# Patient Record
Sex: Female | Born: 1965 | Race: White | Hispanic: No | Marital: Married | Smoking: Current every day smoker
Health system: Southern US, Community
[De-identification: ages and names within clinical notes are randomized; demographics above are authoritative.]

## PROBLEM LIST (undated history)

## (undated) DIAGNOSIS — R51 Headache: Secondary | ICD-10-CM

## (undated) DIAGNOSIS — K219 Gastro-esophageal reflux disease without esophagitis: Secondary | ICD-10-CM

## (undated) DIAGNOSIS — F419 Anxiety disorder, unspecified: Secondary | ICD-10-CM

## (undated) DIAGNOSIS — F32A Depression, unspecified: Secondary | ICD-10-CM

## (undated) DIAGNOSIS — F17200 Nicotine dependence, unspecified, uncomplicated: Secondary | ICD-10-CM

## (undated) DIAGNOSIS — K7010 Alcoholic hepatitis without ascites: Secondary | ICD-10-CM

## (undated) DIAGNOSIS — R772 Abnormality of alphafetoprotein: Secondary | ICD-10-CM

## (undated) DIAGNOSIS — K449 Diaphragmatic hernia without obstruction or gangrene: Secondary | ICD-10-CM

## (undated) DIAGNOSIS — K769 Liver disease, unspecified: Secondary | ICD-10-CM

## (undated) DIAGNOSIS — K8020 Calculus of gallbladder without cholecystitis without obstruction: Secondary | ICD-10-CM

## (undated) DIAGNOSIS — F329 Major depressive disorder, single episode, unspecified: Secondary | ICD-10-CM

## (undated) DIAGNOSIS — K7460 Unspecified cirrhosis of liver: Secondary | ICD-10-CM

## (undated) DIAGNOSIS — D649 Anemia, unspecified: Secondary | ICD-10-CM

## (undated) HISTORY — DX: Liver disease, unspecified: K76.9

## (undated) HISTORY — DX: Diaphragmatic hernia without obstruction or gangrene: K44.9

## (undated) HISTORY — DX: Abnormality of alphafetoprotein: R77.2

## (undated) HISTORY — DX: Anemia, unspecified: D64.9

## (undated) HISTORY — DX: Alcoholic hepatitis without ascites: K70.10

## (undated) HISTORY — DX: Calculus of gallbladder without cholecystitis without obstruction: K80.20

---

## 2001-03-29 ENCOUNTER — Ambulatory Visit (HOSPITAL_COMMUNITY): Admission: RE | Admit: 2001-03-29 | Discharge: 2001-03-29 | Payer: Self-pay | Admitting: General Surgery

## 2001-09-02 ENCOUNTER — Other Ambulatory Visit: Admission: RE | Admit: 2001-09-02 | Discharge: 2001-09-02 | Payer: Self-pay | Admitting: Family Medicine

## 2003-12-28 ENCOUNTER — Ambulatory Visit (HOSPITAL_COMMUNITY): Admission: RE | Admit: 2003-12-28 | Discharge: 2003-12-28 | Payer: Self-pay | Admitting: Family Medicine

## 2006-03-05 ENCOUNTER — Ambulatory Visit (HOSPITAL_COMMUNITY): Admission: RE | Admit: 2006-03-05 | Discharge: 2006-03-05 | Payer: Self-pay | Admitting: Family Medicine

## 2006-12-19 ENCOUNTER — Ambulatory Visit (HOSPITAL_COMMUNITY): Admission: RE | Admit: 2006-12-19 | Discharge: 2006-12-19 | Payer: Self-pay | Admitting: Family Medicine

## 2007-09-10 ENCOUNTER — Other Ambulatory Visit: Admission: RE | Admit: 2007-09-10 | Discharge: 2007-09-10 | Payer: Self-pay | Admitting: Obstetrics and Gynecology

## 2007-09-19 ENCOUNTER — Ambulatory Visit (HOSPITAL_COMMUNITY): Admission: RE | Admit: 2007-09-19 | Discharge: 2007-09-19 | Payer: Self-pay | Admitting: Family Medicine

## 2007-10-10 ENCOUNTER — Ambulatory Visit (HOSPITAL_COMMUNITY): Admission: RE | Admit: 2007-10-10 | Discharge: 2007-10-10 | Payer: Self-pay | Admitting: Family Medicine

## 2007-12-24 ENCOUNTER — Emergency Department (HOSPITAL_COMMUNITY): Admission: EM | Admit: 2007-12-24 | Discharge: 2007-12-24 | Payer: Self-pay | Admitting: Emergency Medicine

## 2007-12-24 ENCOUNTER — Ambulatory Visit: Payer: Self-pay | Admitting: Psychiatry

## 2007-12-24 ENCOUNTER — Inpatient Hospital Stay (HOSPITAL_COMMUNITY): Admission: RE | Admit: 2007-12-24 | Discharge: 2007-12-28 | Payer: Self-pay | Admitting: Psychiatry

## 2008-01-01 ENCOUNTER — Ambulatory Visit (HOSPITAL_COMMUNITY): Payer: Self-pay | Admitting: Psychiatry

## 2008-01-06 ENCOUNTER — Ambulatory Visit (HOSPITAL_COMMUNITY): Payer: Self-pay | Admitting: Psychiatry

## 2008-01-13 ENCOUNTER — Ambulatory Visit (HOSPITAL_COMMUNITY): Payer: Self-pay | Admitting: Psychiatry

## 2009-12-21 ENCOUNTER — Ambulatory Visit (HOSPITAL_COMMUNITY): Admission: RE | Admit: 2009-12-21 | Discharge: 2009-12-21 | Payer: Self-pay | Admitting: Family Medicine

## 2010-01-27 ENCOUNTER — Ambulatory Visit (HOSPITAL_COMMUNITY): Admission: RE | Admit: 2010-01-27 | Discharge: 2010-01-27 | Payer: Self-pay | Admitting: Family Medicine

## 2010-05-27 ENCOUNTER — Ambulatory Visit (HOSPITAL_COMMUNITY): Admission: RE | Admit: 2010-05-27 | Discharge: 2010-05-27 | Payer: Self-pay | Admitting: Family Medicine

## 2010-06-16 ENCOUNTER — Ambulatory Visit: Payer: Self-pay | Admitting: Internal Medicine

## 2010-06-16 ENCOUNTER — Ambulatory Visit (HOSPITAL_COMMUNITY): Admission: RE | Admit: 2010-06-16 | Discharge: 2010-06-16 | Payer: Self-pay | Admitting: Internal Medicine

## 2010-06-16 ENCOUNTER — Encounter: Payer: Self-pay | Admitting: Gastroenterology

## 2010-06-16 DIAGNOSIS — R740 Nonspecific elevation of levels of transaminase and lactic acid dehydrogenase [LDH]: Secondary | ICD-10-CM

## 2010-06-16 DIAGNOSIS — F1020 Alcohol dependence, uncomplicated: Secondary | ICD-10-CM

## 2010-06-16 DIAGNOSIS — R188 Other ascites: Secondary | ICD-10-CM

## 2010-06-16 DIAGNOSIS — K219 Gastro-esophageal reflux disease without esophagitis: Secondary | ICD-10-CM

## 2010-06-16 DIAGNOSIS — D72829 Elevated white blood cell count, unspecified: Secondary | ICD-10-CM | POA: Insufficient documentation

## 2010-06-16 DIAGNOSIS — K7010 Alcoholic hepatitis without ascites: Secondary | ICD-10-CM

## 2010-06-16 DIAGNOSIS — D539 Nutritional anemia, unspecified: Secondary | ICD-10-CM | POA: Insufficient documentation

## 2010-06-16 DIAGNOSIS — R634 Abnormal weight loss: Secondary | ICD-10-CM

## 2010-06-16 DIAGNOSIS — R197 Diarrhea, unspecified: Secondary | ICD-10-CM

## 2010-06-16 DIAGNOSIS — K921 Melena: Secondary | ICD-10-CM

## 2010-06-16 DIAGNOSIS — K5289 Other specified noninfective gastroenteritis and colitis: Secondary | ICD-10-CM

## 2010-06-16 DIAGNOSIS — R1310 Dysphagia, unspecified: Secondary | ICD-10-CM | POA: Insufficient documentation

## 2010-06-16 DIAGNOSIS — R05 Cough: Secondary | ICD-10-CM

## 2010-06-17 ENCOUNTER — Encounter: Payer: Self-pay | Admitting: Gastroenterology

## 2010-06-17 DIAGNOSIS — R799 Abnormal finding of blood chemistry, unspecified: Secondary | ICD-10-CM

## 2010-06-21 ENCOUNTER — Encounter: Payer: Self-pay | Admitting: Gastroenterology

## 2010-06-21 ENCOUNTER — Encounter: Payer: Self-pay | Admitting: Internal Medicine

## 2010-06-21 LAB — CONVERTED CEMR LAB: Hep A IgM: NEGATIVE

## 2010-06-22 ENCOUNTER — Encounter (INDEPENDENT_AMBULATORY_CARE_PROVIDER_SITE_OTHER): Payer: Self-pay | Admitting: *Deleted

## 2010-06-22 LAB — CONVERTED CEMR LAB
A-1 Antitrypsin, Ser: 303 mg/dL — ABNORMAL HIGH (ref 83–200)
ALT: 28 units/L (ref 0–35)
AST: 155 units/L — ABNORMAL HIGH (ref 0–37)
CO2: 21 meq/L (ref 19–32)
Calcium: 7.4 mg/dL — ABNORMAL LOW (ref 8.4–10.5)
Ceruloplasmin: 39 mg/dL (ref 21–63)
Chloride: 98 meq/L (ref 96–112)
Creatinine, Ser: 0.42 mg/dL (ref 0.40–1.20)
Folate: 4.8 ng/mL
HCV Ab: NEGATIVE
INR: 1.45 (ref <1.50)
IgM, Serum: 367 mg/dL — ABNORMAL HIGH (ref 60–263)
Lymphocytes Relative: 9 % — ABNORMAL LOW (ref 12–46)
Lymphs Abs: 1.6 10*3/uL (ref 0.7–4.0)
MCV: 101.7 fL — ABNORMAL HIGH (ref 78.0–100.0)
Monocytes Relative: 9 % (ref 3–12)
Neutro Abs: 14.5 10*3/uL — ABNORMAL HIGH (ref 1.7–7.7)
Neutrophils Relative %: 82 % — ABNORMAL HIGH (ref 43–77)
Platelets: 194 10*3/uL (ref 150–400)
Potassium: 3.1 meq/L — ABNORMAL LOW (ref 3.5–5.3)
Prothrombin Time: 17.5 s — ABNORMAL HIGH (ref 11.6–15.2)
RBC: 3.56 M/uL — ABNORMAL LOW (ref 3.87–5.11)
Sodium: 135 meq/L (ref 135–145)
Total Protein: 8.1 g/dL (ref 6.0–8.3)
WBC: 17.8 10*3/uL — ABNORMAL HIGH (ref 4.0–10.5)

## 2010-06-23 ENCOUNTER — Encounter: Payer: Self-pay | Admitting: Gastroenterology

## 2010-06-23 ENCOUNTER — Telehealth (INDEPENDENT_AMBULATORY_CARE_PROVIDER_SITE_OTHER): Payer: Self-pay

## 2010-06-27 ENCOUNTER — Encounter: Payer: Self-pay | Admitting: Gastroenterology

## 2010-06-27 HISTORY — PX: COLONOSCOPY: SHX174

## 2010-06-27 LAB — CONVERTED CEMR LAB
ALT: 34 units/L (ref 0–35)
Albumin: 1.5 g/dL — ABNORMAL LOW (ref 3.5–5.2)
Alkaline Phosphatase: 241 units/L — ABNORMAL HIGH (ref 39–117)
Indirect Bilirubin: 2.1 mg/dL — ABNORMAL HIGH (ref 0.0–0.9)
Prothrombin Time: 18.4 s — ABNORMAL HIGH (ref 11.6–15.2)
Total Protein: 8.3 g/dL (ref 6.0–8.3)

## 2010-06-28 ENCOUNTER — Encounter: Payer: Self-pay | Admitting: Internal Medicine

## 2010-06-29 ENCOUNTER — Ambulatory Visit: Payer: Self-pay | Admitting: Internal Medicine

## 2010-06-29 ENCOUNTER — Ambulatory Visit (HOSPITAL_COMMUNITY): Admission: RE | Admit: 2010-06-29 | Discharge: 2010-06-29 | Payer: Self-pay | Admitting: Internal Medicine

## 2010-07-01 ENCOUNTER — Ambulatory Visit (HOSPITAL_COMMUNITY): Admission: RE | Admit: 2010-07-01 | Discharge: 2010-07-01 | Payer: Self-pay | Admitting: Internal Medicine

## 2010-07-04 ENCOUNTER — Encounter (INDEPENDENT_AMBULATORY_CARE_PROVIDER_SITE_OTHER): Payer: Self-pay

## 2010-07-05 ENCOUNTER — Encounter (INDEPENDENT_AMBULATORY_CARE_PROVIDER_SITE_OTHER): Payer: Self-pay

## 2010-07-06 ENCOUNTER — Encounter: Payer: Self-pay | Admitting: Gastroenterology

## 2010-07-08 ENCOUNTER — Encounter: Payer: Self-pay | Admitting: Gastroenterology

## 2010-07-12 ENCOUNTER — Encounter (INDEPENDENT_AMBULATORY_CARE_PROVIDER_SITE_OTHER): Payer: Self-pay

## 2010-07-12 ENCOUNTER — Telehealth (INDEPENDENT_AMBULATORY_CARE_PROVIDER_SITE_OTHER): Payer: Self-pay | Admitting: *Deleted

## 2010-07-12 ENCOUNTER — Encounter (INDEPENDENT_AMBULATORY_CARE_PROVIDER_SITE_OTHER): Payer: Self-pay | Admitting: *Deleted

## 2010-07-12 ENCOUNTER — Encounter: Payer: Self-pay | Admitting: Gastroenterology

## 2010-07-12 LAB — CONVERTED CEMR LAB
ALT: 28 units/L (ref 0–35)
AST: 56 units/L — ABNORMAL HIGH (ref 0–37)
Alkaline Phosphatase: 151 units/L — ABNORMAL HIGH (ref 39–117)
Basophils Absolute: 0 10*3/uL (ref 0.0–0.1)
Basophils Relative: 0 % (ref 0–1)
INR: 1.11 (ref <1.50)
Lymphocytes Relative: 6 % — ABNORMAL LOW (ref 12–46)
MCHC: 32.5 g/dL (ref 30.0–36.0)
Neutro Abs: 20.6 10*3/uL — ABNORMAL HIGH (ref 1.7–7.7)
Neutrophils Relative %: 91 % — ABNORMAL HIGH (ref 43–77)
RBC: 3.14 M/uL — ABNORMAL LOW (ref 3.87–5.11)
RDW: 16.9 % — ABNORMAL HIGH (ref 11.5–15.5)
Sodium: 129 meq/L — ABNORMAL LOW (ref 135–145)
Total Bilirubin: 1.8 mg/dL — ABNORMAL HIGH (ref 0.3–1.2)
Total Protein: 7 g/dL (ref 6.0–8.3)

## 2010-07-15 ENCOUNTER — Telehealth (INDEPENDENT_AMBULATORY_CARE_PROVIDER_SITE_OTHER): Payer: Self-pay

## 2010-07-18 ENCOUNTER — Ambulatory Visit (HOSPITAL_COMMUNITY): Admission: RE | Admit: 2010-07-18 | Discharge: 2010-07-18 | Payer: Self-pay | Admitting: Internal Medicine

## 2010-07-18 ENCOUNTER — Emergency Department (HOSPITAL_COMMUNITY): Admission: EM | Admit: 2010-07-18 | Discharge: 2010-07-18 | Payer: Self-pay | Admitting: Emergency Medicine

## 2010-07-19 ENCOUNTER — Inpatient Hospital Stay (HOSPITAL_COMMUNITY): Admission: RE | Admit: 2010-07-19 | Discharge: 2010-07-22 | Payer: Self-pay | Admitting: Family Medicine

## 2010-07-20 ENCOUNTER — Encounter (INDEPENDENT_AMBULATORY_CARE_PROVIDER_SITE_OTHER): Payer: Self-pay

## 2010-07-20 ENCOUNTER — Ambulatory Visit: Payer: Self-pay | Admitting: Oncology

## 2010-07-20 ENCOUNTER — Telehealth (INDEPENDENT_AMBULATORY_CARE_PROVIDER_SITE_OTHER): Payer: Self-pay

## 2010-07-20 ENCOUNTER — Ambulatory Visit: Payer: Self-pay | Admitting: Internal Medicine

## 2010-07-22 ENCOUNTER — Encounter: Payer: Self-pay | Admitting: Internal Medicine

## 2010-07-25 ENCOUNTER — Inpatient Hospital Stay (HOSPITAL_COMMUNITY): Admission: AD | Admit: 2010-07-25 | Discharge: 2010-07-31 | Payer: Self-pay | Admitting: Family Medicine

## 2010-07-26 ENCOUNTER — Encounter (INDEPENDENT_AMBULATORY_CARE_PROVIDER_SITE_OTHER): Payer: Self-pay | Admitting: Family Medicine

## 2010-07-26 ENCOUNTER — Encounter: Payer: Self-pay | Admitting: Internal Medicine

## 2010-07-26 ENCOUNTER — Ambulatory Visit: Payer: Self-pay | Admitting: Gastroenterology

## 2010-07-28 ENCOUNTER — Ambulatory Visit: Payer: Self-pay | Admitting: Internal Medicine

## 2010-07-29 ENCOUNTER — Ambulatory Visit: Payer: Self-pay | Admitting: Internal Medicine

## 2010-08-02 ENCOUNTER — Encounter: Payer: Self-pay | Admitting: Internal Medicine

## 2010-08-04 ENCOUNTER — Ambulatory Visit (HOSPITAL_COMMUNITY): Admission: RE | Admit: 2010-08-04 | Discharge: 2010-08-04 | Payer: Self-pay | Admitting: Family Medicine

## 2010-08-12 ENCOUNTER — Encounter: Payer: Self-pay | Admitting: Gastroenterology

## 2010-09-14 IMAGING — US US PARACENTESIS
1 series · 4 of 4 positions shown · non-contrast
Comparison: CT dated 05/27/2010.

CLINICAL DATA: Ascites.

ULTRASOUND GUIDED PARACENTESIS

[Series 1: us paracentesis · 0.30mm/px · 4 of 4 slices shown]
[im 1/4]
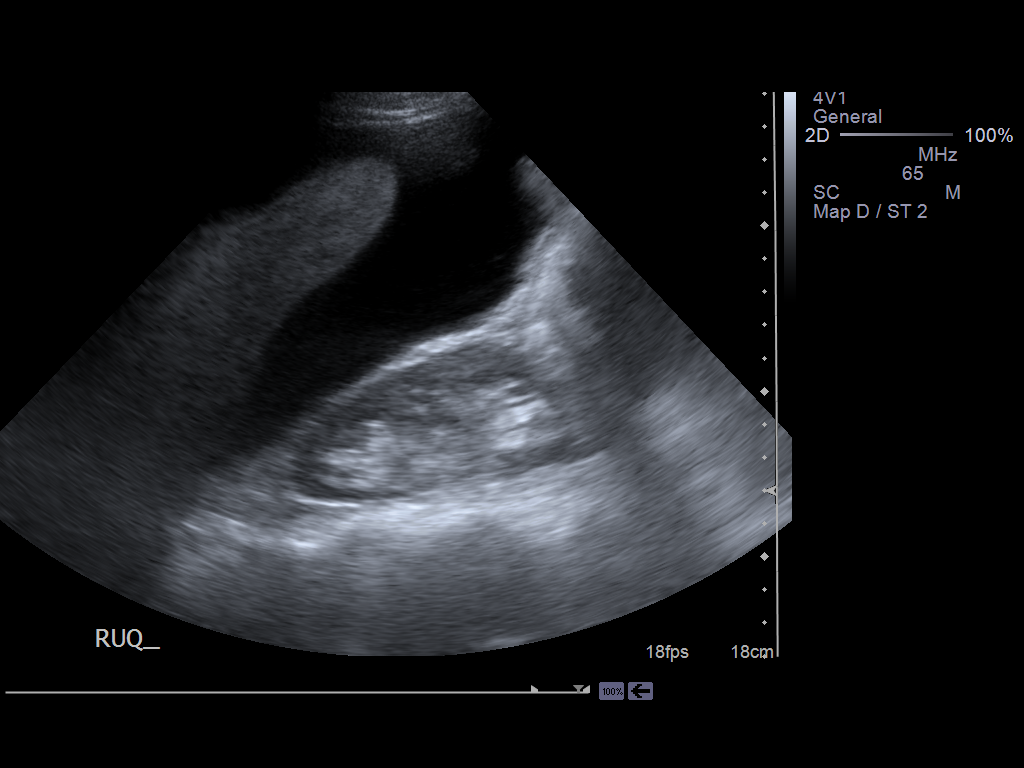
[im 2/4]
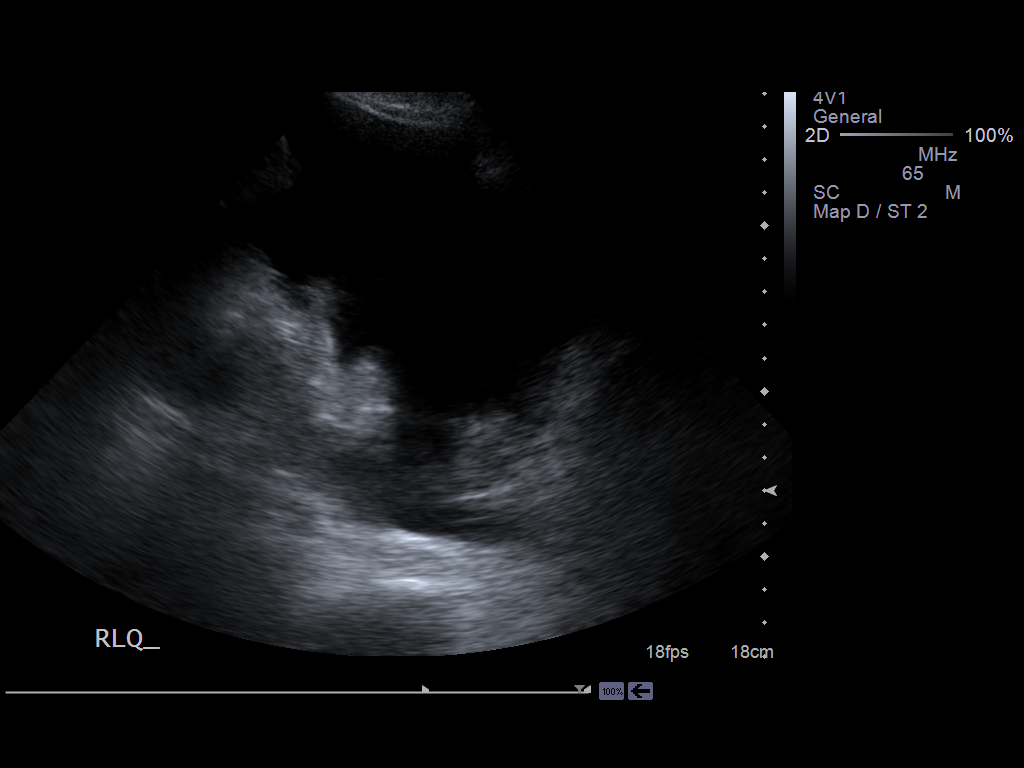
[im 3/4]
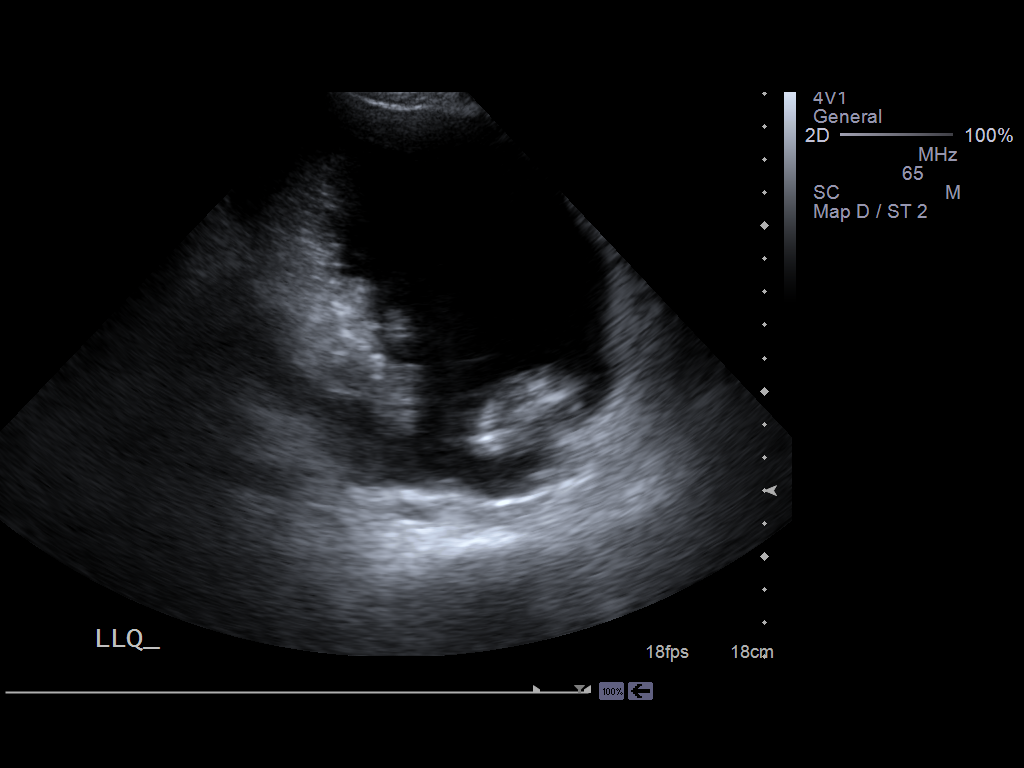
[im 4/4]
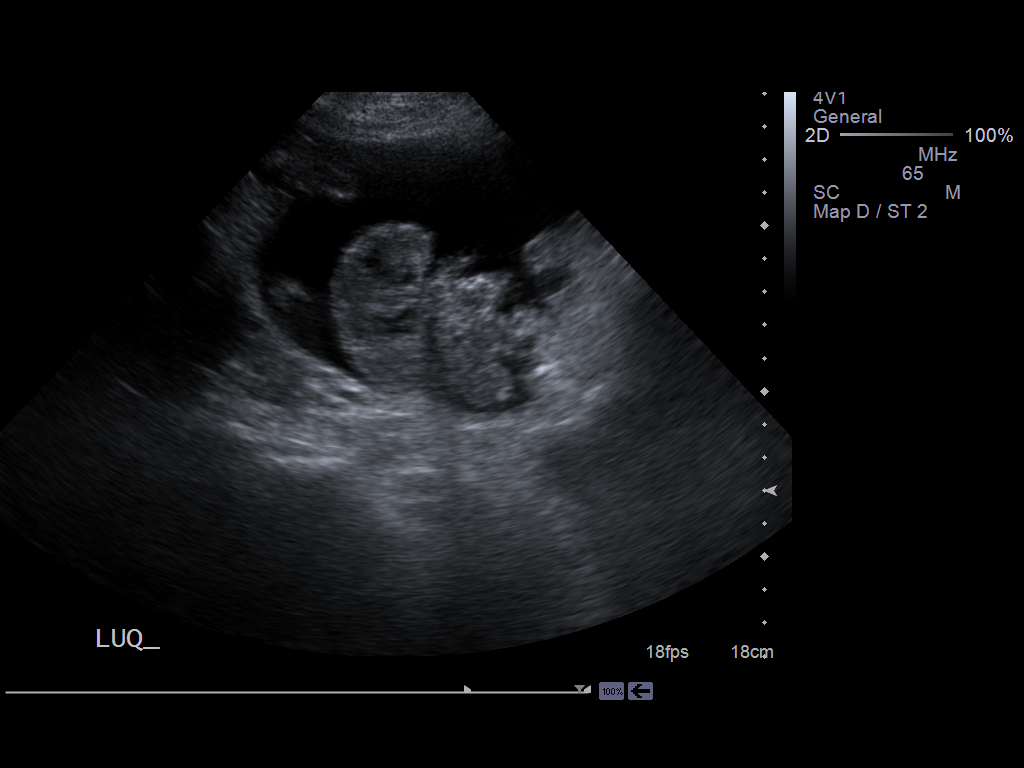

[4 of 4 positions shown; findings below may reference images not displayed]

Description: The procedure and risks were discussed with the
patient, including the risks of bleeding and infection.  Informed
written consent was obtained.

Preliminary sonographic evaluation of the abdomen demonstrated a
moderate amount of ascites.  A suitable site for paracentesis was
chosen in the right lower quadrant of the abdomen.  Using sterile
technique and local anesthesia, a 19 gauge Mewangi needle was inserted
into the peritoneal cavity.  2.95 liters of clear, straw-colored
fluid was withdrawn.  180 ml was sent to the laboratory for
testing.  The patient tolerated the procedure well with no
immediate complications.
IMPRESSION: Successful ultrasound-guided paracentesis yielding 2.95 liters of
straw-colored fluid.

## 2010-10-09 IMAGING — US US PARACENTESIS
1 series · 5 of 5 positions shown · non-contrast
Comparison: none

CLINICAL DATA: Ascites, cirrhosis

ULTRASOUND-GUIDED PARACENTESIS
TECHNIQUE: An ultrasound-guided paracentesis was thoroughly
discussed with the patient and questions answered.  The benefits,
risks, alternatives and complications were also discussed.  The
patient understands and wishes to proceed with the procedure.  A
verbal as well as written consent was obtained. Ultrasound was
performed to localize and mark an adequate pocket of fluid in the
right lower quadrant of the abdomen.  The area was then prepped and
draped in the normal sterile fashion.  1% Lidocaine was used for
local anesthesia.  Under ultrasound guidance a 19-gauge Yueh
catheter was introduced yielding approximately 3.6 liters of straw-
colored fluid.  The patient tolerated the procedure well and there
were no immediate complications.
Fluid was sent to cytology, as requested.

[Series 1: us paracentesis · 0.39mm/px · 5 of 5 slices shown]
[im 1/5]
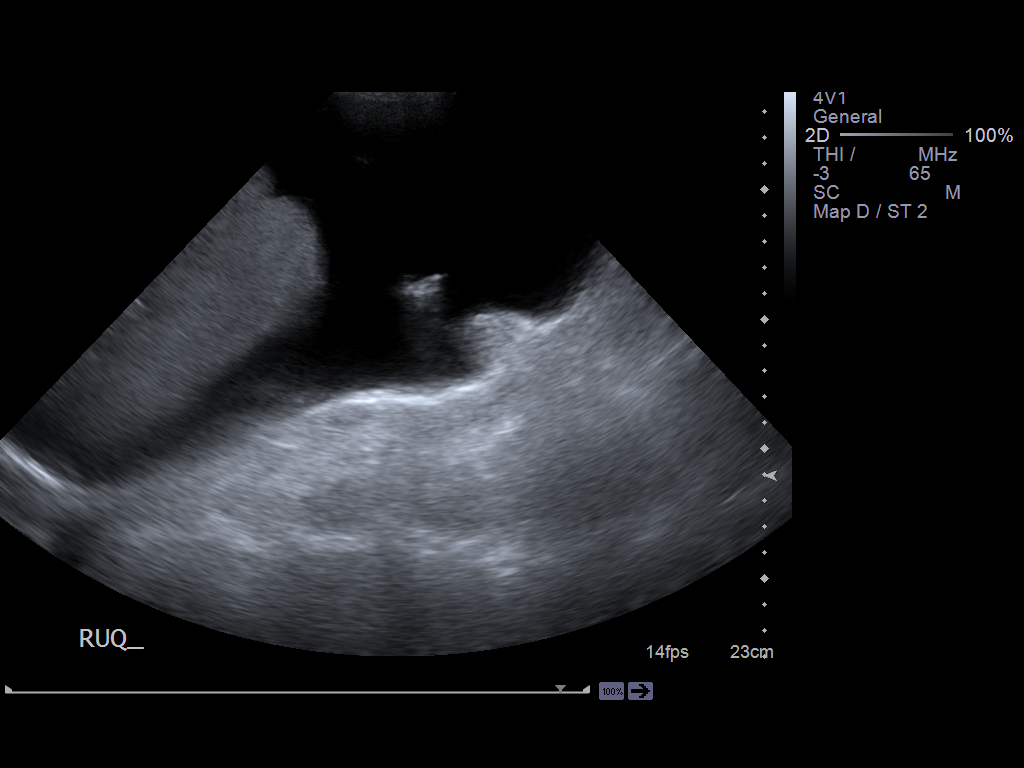
[im 2/5]
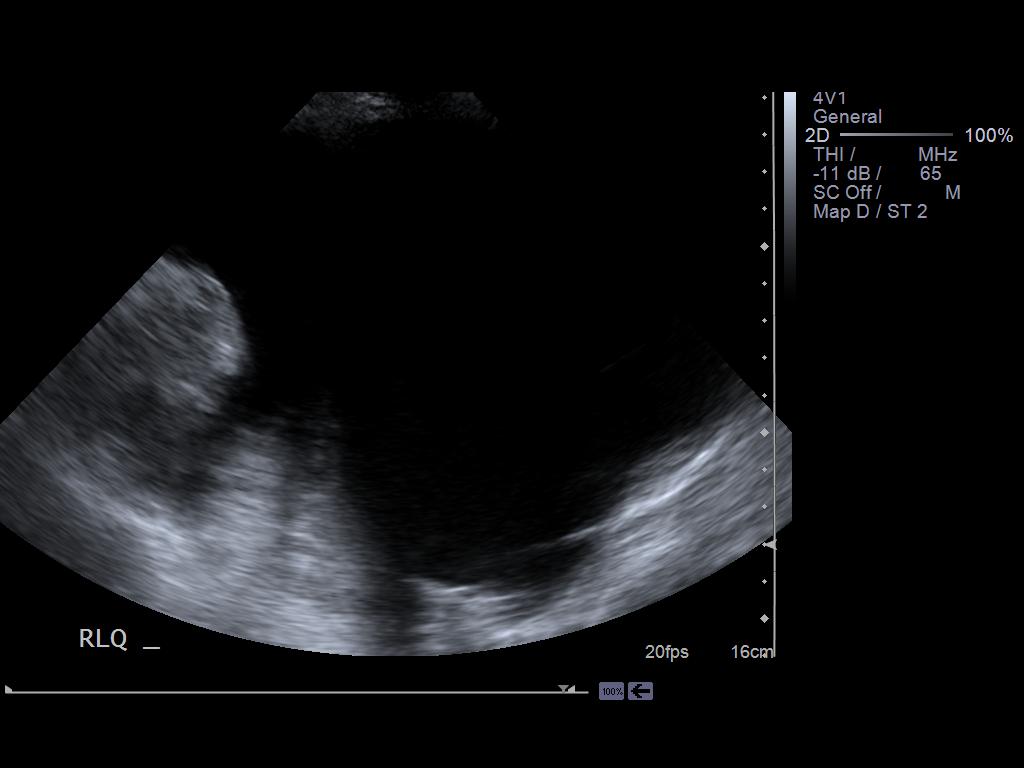
[im 3/5]
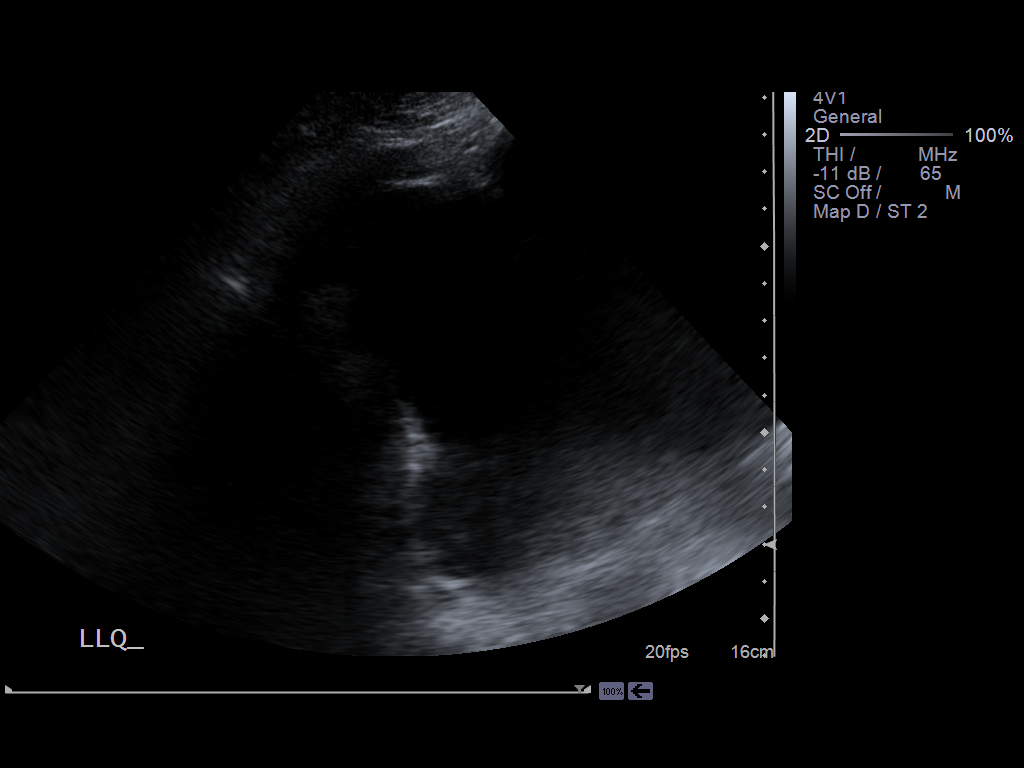
[im 4/5]
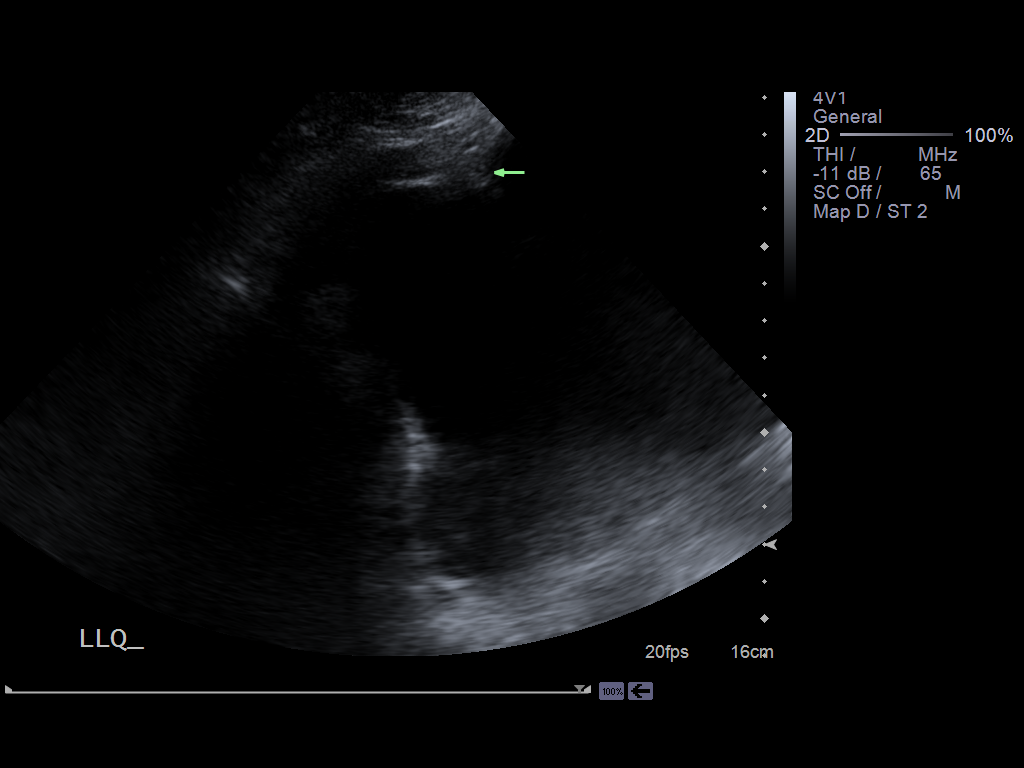
[im 5/5]
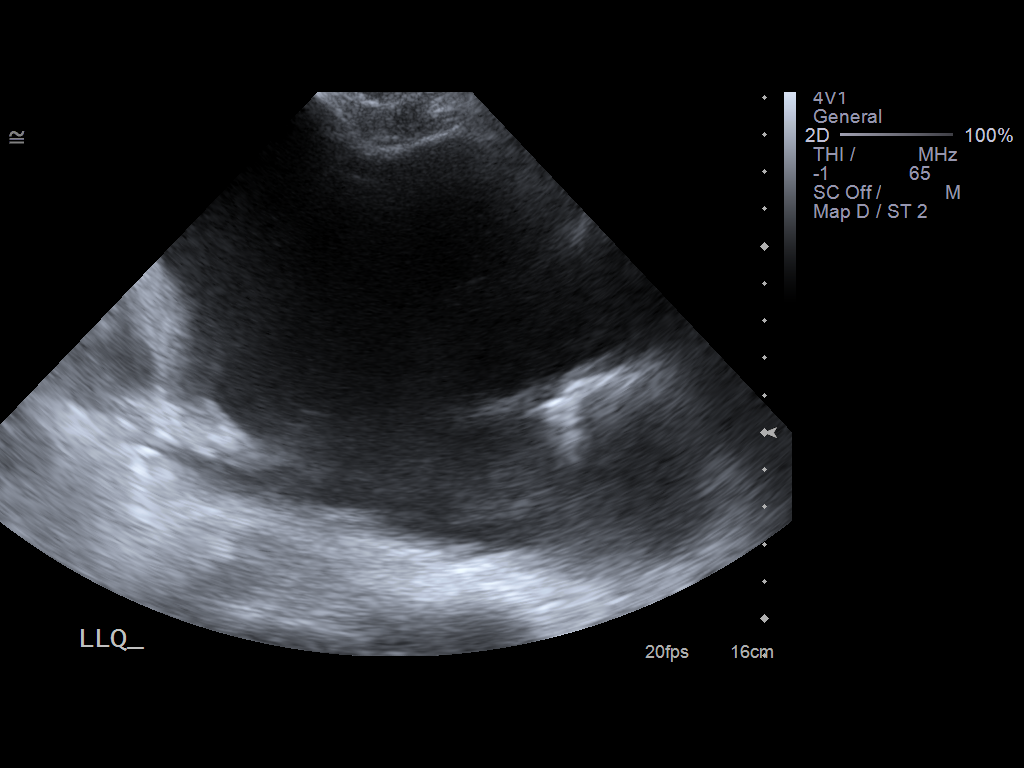

[5 of 5 positions shown; findings below may reference images not displayed]

IMPRESSION: Successful ultrasound-guided paracentesis yielding 3.6 liters of
straw-colored fluid.

## 2010-12-27 NOTE — Letter (Signed)
Summary: LABS  LABS   Imported By: Rexene Alberts 06/28/2010 16:14:52  _____________________________________________________________________  External Attachment:    Type:   Image     Comment:   External Document

## 2010-12-27 NOTE — Letter (Signed)
Summary: EGD/TCS OR order  EGD/TCS OR order   Imported By: Minna Merritts 06/16/2010 13:47:49  _____________________________________________________________________  External Attachment:    Type:   Image     Comment:   External Document  Appended Document: EGD/TCS OR order At scheduling forgot to give pt. pre-op visit info.  called and spoke with pt today.  She is aware to go for pre-op on 06/22/10 @ 10am

## 2010-12-27 NOTE — Miscellaneous (Signed)
Summary: Orders Update  Clinical Lists Changes  Orders: Added new Test order of T-Hepatic Function ([id number]) - Signed 

## 2010-12-27 NOTE — Miscellaneous (Signed)
Summary: Orders Update  Clinical Lists Changes  Orders: Added new Test order of T-PT (Prothrombin Time) (16109) - Signed Added new Test order of T-PTT ([id number]) - Signed

## 2010-12-27 NOTE — Miscellaneous (Signed)
Summary: Orders Update  Clinical Lists Changes  Problems: Added new problem of BLOOD CHEMISTRY, ABNORMAL (ICD-790.99) Orders: Added new Test order of T-Comprehensive Metabolic Panel [phone]) - Signed Added new Test order of T-PT (Prothrombin Time) (08657) - Signed

## 2010-12-27 NOTE — Letter (Signed)
Summary: ?? RE: LFT bloodwork  ---- Converted from flag ---- ---- 06/22/2010 5:44 PM, Minna Merritts wrote: [facility], Per our discussion: We can take a look at it, but I didn't check for LFTs b/c it was not on the encounter form which is what I work from to schedule pts in OR and do paperwork (see scanned copy of OV encounter in 1st EGD/TCS order).  I see orders to recheck labs was added by JL and it appears they were sent on 7/22 for the CMP & PT and entered by LL in the EMR system, but didn't see any other order for LFTs until the append from LL on 7/27 @ 4:59p ...we ALWAYS just check per [facility] orders b/c we don't know what all they want unless something else is indicated; and the only reason I refaxed an order was due to the scheduling error that I made mixing up 8/2 & 8/3 and on the second one added the HCG that was ordered in append, but still only left per A orders as protocol.  ---- 06/22/2010 4:57 PM, Leanna Battles. Dixon Boos wrote: Angelica Chessman only faxed order for HCG. She apparently did not know the CMET and PT/INR were not on the original preop order. Pt has to now go tomorrow to have labs done. Please arrange. Need done first thing in AM.  ---- 06/22/2010 4:33 PM, Cloria Spring LPN wrote: Was done and faxed by [facility] on 06/21/2010.  ---- 06/21/2010 2:24 PM, Leanna Battles. Dixon Boos wrote: pt has pre-op tomorrow. also due for labs tomorrow. please have preop draw them. send them order. note I added hcg as well as planned labs. ------------------------------

## 2010-12-27 NOTE — Miscellaneous (Signed)
Summary: fluid results from [facility]  Clinical Lists Changes Culture, Body Fluid(Bottle) - STATUS: Prelim  SEE NOTE.                                 Perform Date: 5 Aug11 10:30  Ordered By: Jena Gauss MD , Gerrit Friends           Ordered Date: 5 Aug11 13:02  Facility: [facility]                               Department: MICR  Service Report Text     SPECIMEN OBTAINED:            07/01/2010 10:30  SPECIMEN DESCRIPTION:         FLUID                                ASCITIC  SPECIAL REQUESTS:             BOTTLES DRAWN AEROBIC AND ANAEROBIC                                10CC EACH  CULTURE:                      NO GROWTH 4 DAYS  REPORT STATUS:                PRELIMINARY  Additional Information  HL7 RESULT STATUS : P  External IF Update Timestamp : 2010-07-05:11:18:00.000000     L-Cell Count and Differential, Fluid - STATUS: Final                                            Perform Date: 5 Aug11 10:30  Ordered By: Jena Gauss MD , Gerrit Friends           Ordered Date: 5 Aug11 13:04                                       Last Updated Date: 5 Aug11 18:36  Facility: [facility]                               Department: GENL  Accession #: [id number]                    USN:       [id number]  Findings  Result Name                              Result     Abnl   Normal Range     Units      Perf. Loc.  Fluid Type-FCT                           ASCITIC  Color, Fluid  YELLOW            YEL  Appearance-Fluid                           HAZY       a      CLEAR  WBC Count-FLD                            155               0-1000           cu mm  Segmented Neutrophils-Fld              46         h      0-25             %  Lymphocytes-FLUID                        2                                  %  Monocyte-Macrophage-Serous Fluid    52                50-90            %  Eosinophils-Fluid                                0                                  %  Other Cells-FLD                           SEE NOTE.                          %    OTHER CELLS IDENTIFIED AS MESOTHELIAL CELLS  Additional Information  HL7 RESULT STATUS : F  External IF Update Timestamp : 2010-07-01:18:32:00.000000     Gram Stain - STATUS: Final  SEE NOTE.                                 Perform Date: 5 Aug11 10:30  Ordered By: Jena Gauss MD , Gerrit Friends           Ordered Date: 5 Aug11 13:02  Facility: [facility]                               Department: MICR  Service Report Text     SPECIMEN OBTAINED:            07/01/2010 10:30  SPECIMEN DESCRIPTION:         ASCITIC  SPECIAL REQUESTS:             NONE  GRAM SMEAR:                   NO ORGANISMS SEEN  REPORT STATUS:  FINAL 07/01/2010  Additional Information  HL7 RESULT STATUS : F  External IF Update Timestamp : 2010-07-01:14:26:00.000000  Appended Document: Orders Update Please let pt know, her abd fluid tap showed no signs of infection.  She is now on prednisone. Should recheck CBC, CMET, PT/INR next week. Abstain from alcohol.  RMR should provide further recommendations regarding colon biopsies.   Clinical Lists Changes  Orders: Added new Test order of T-Comprehensive Metabolic Panel [phone]) - Signed Added new Test order of T-CBC w/Diff ([id number]) - Signed Added new Test order of T-PT (Prothrombin Time) (56387) - Signed      Appended Document: fluid results from [facility] Pt informed. Lab order being faxed to [facility]. Pt wants to know what about the fluid she has now, said she has a "little bit" of fluid. Please advise!  Appended Document: fluid results from [facility] Pt said she has some abdominal fluid.....she also said to let Verlon Au know that she is getting very nervous at times, and she is taking the medication for the tremors, but still getting very anxious. Said she has not had alcohol x 2 1/2 weeks.   Appended Document: fluid results from [facility] 1. Needs OV with RMR in next 2-3 weeks. 2. She should weigh herself at least  once a week and let us know if weight up by 5 pounds. She can come here if needed for weights. 3. May increase her Librium (chlordiazepoxide) to 25-50mg  by mouth every 6 hours as needed. Do not exceed 300mg  daily. May call in #30 and 0rfs if needed.   4. She can increase her aldactone to 50mg  by mouth daily. May give #30, Orf if needed.  Appended Document: fluid results from [facility] LMOM to call.  Appended Document: fluid results from [facility] Pt informed. Called Rx's to [employer].   Appended Document: fluid results from [facility] PT AWARE OF APPT 07/26/10 @415  Burnett Harry

## 2010-12-27 NOTE — Letter (Signed)
Summary: CXR  CXR   Imported By: Minna Merritts 06/16/2010 13:46:30  _____________________________________________________________________  External Attachment:    Type:   Image     Comment:   External Document

## 2010-12-27 NOTE — Letter (Signed)
Summary: CYTOPATHOLOGY REPORT  CYTOPATHOLOGY REPORT   Imported By: Rexene Alberts 08/02/2010 10:21:25  _____________________________________________________________________  External Attachment:    Type:   Image     Comment:   External Document

## 2010-12-27 NOTE — Progress Notes (Signed)
----   Converted from flag ---- ---- 07/12/2010 8:21 AM, Leanna Battles. Dixon Boos wrote: Please make sure pt is seen by rmr by the end of Aug. Thx. ------------------------------

## 2010-12-27 NOTE — Letter (Signed)
Summary: RADIOLOGY [place] THORA/PARA  RADIOLOGY [place] THORA/PARA   Imported By: Rexene Alberts 07/26/2010 15:18:40  _____________________________________________________________________  External Attachment:    Type:   Image     Comment:   External Document

## 2010-12-27 NOTE — Letter (Signed)
Summary: TCS/EGD in OR (RSC'd) order  TCS/EGD in OR (RSC'd) order   Imported By: Minna Merritts 06/21/2010 18:34:44  _____________________________________________________________________  External Attachment:    Type:   Image     Comment:   External Document

## 2010-12-27 NOTE — Letter (Signed)
Summary: Recall, Labs Needed  [facility]  [address]   [place], [place] [address]   Phone: [phone]  Fax: [phone]    July 12, 2010  Katrina Roberts [address] [place], [place]  [address] [birth year]   Dear Katrina Roberts,   Our records indicate it is time to repeat your blood work.  You can take the enclosed form to the lab on or near the date indicated.  Please make note of the new location of the lab:   [address], 2nd floor   [facility]  Our office will call you within a week to ten business days with the results.  If you do not hear from [place] in 10 business days, you should call the office.  If you have any questions regarding this, call the office at [phone], and ask for the nurse.  Labs are due on 07/18/2010 and 07/25/2010. Please note the dates on the lab orders.   Sincerely,    Hendricks Limes LPN  [facility] Ph: [phone]   Fax: [phone]

## 2010-12-27 NOTE — Miscellaneous (Signed)
Summary: Orders Update  Clinical Lists Changes  Orders: Added new Test order of T-CBC w/Diff ([id number]) - Signed Added new Test order of T-Basic Metabolic Panel ([id number]) - Signed 

## 2010-12-27 NOTE — Miscellaneous (Signed)
Summary: ascitid fluid pathology  CYT-Cytology - STATUS: Final  .                                         Perform Date: 5 Aug11 00:01  Ordered By: DEFAULT MD , PROVIDER         Ordered Date:  Facility: [facility]                               Department: CPATH  Service Report Text  [facility]   [address]   [place], [place] [address]   Telephone [phone] or [phone] Fax [phone]    REPORT OF CYTOPATHOLOGY    Case #: [id number]   Patient Name: BRIANTE, Katrina Roberts   Office Chart Number: [id number]    MRN: [id number]   Pathologist: Laureen Ochs M.D., Jessica Priest   DOB/Age Jun 04, 1966 (Age: 45) Gender: F   Date Taken: 07/01/2010   Date Received: 07/04/2010    FINAL DIAGNOSIS   ***Microscopic Examination and Diagnosis***   STATEMENT Of SPECIMEN ADEQUACY:   Satisfactory for evaluation.    INTERPRETATION(S):   PERITONEAL /ASCITIC FLUID:   Reactive mesothelial cells PRESENT    DATE REPORTED: 07/05/2010   *** Electronically Signed Out by Smir M.D., Bassam, Pathologist, Electronic Signature ***    CLINICAL HISTORY    SOURCE OF SPECIMEN(S)   Peritoneal/Ascitic fluid    SPECIMEN COMMENTS:    Gross Description   Specimen: Received are 120 ccs of clear yellow fluid. (Cm:Cm)   Prepared:   # smears: 0   # concentration technique slides (i.e. Thinprep): 1   # cell block: 1   additional studies: N/A   Additional Information  HL7 RESULT STATUS : F  External IF Update Timestamp : 2010-07-05:16:33:00.000000 Clinical Lists Changes  Appended Document: ascitid fluid pathology Please let pt know her fluid showed no signs of cancer.  Appended Document: ascitid fluid pathology Pt informed.

## 2010-12-27 NOTE — Letter (Signed)
Summary: referral from dr. [name]  referral from dr. [name]   Imported By: Rosine Beat 06/21/2010 09:49:46  _____________________________________________________________________  External Attachment:    Type:   Image     Comment:   External Document

## 2010-12-27 NOTE — Assessment & Plan Note (Signed)
Summary: wt loss,sed rate 97,elevated LFTs,hx ETOH/ss   Visit Type:  Initial Consult Referring Provider:  [name] Primary Care Provider:  [name]  Chief Complaint:  wt loss/elevated LFT's.  History of Present Illness: Katrina Roberts is a pleasant 45 y/o WF, patient of Dr. John Giovanni, who presents for further evaluation of abnormal LFTs, weight loss, abnormal CT.  Patient states for the past four months she has been feeling poorly. Symtpoms initially started as chest congestion and cough. She reports three separate round of antibiotics without relief. She is being treated for allergies but she c/o ongoing symptoms. She c/o numbness in her calves bilaterally for three weeks. Monday she noted swelling in her stomach, "twice the normal size". She has h/o chronic GERD, heartburn controlled on omeprazole. She c/o n/v if she gets "upset". C/O dysphagia to solid foods. Stools are black and runny for past six weeks. No fever. No hematemesis. BRBPR on toilet tissue. Some bowel incontinence. At least five BMs daily. C/O poor appetite. Weighed 135 pounds first part of this year. Now 117.  Labs 05/20/10: WBC 19,800, H/H 12.6/36.1, MCV 105.2, Plt 259,000, sed rate 94, K 3.2, Cre 0.47, T bili 3.6, AP 285, AST 182, ALT 42, alb 3. H.Pylori negative.   CT Chest, A/P on 05/27/10: Abnormal thyroid gland with low density nodules, heterogeneity, coarse calcifications. U/S or nuc med imaging recommended. Diffuse fatty liver, biliary sludge. Lungs clear. Moderate, diffuse colonic wall thickening most severe at cecum and terminal ileum, small scattered lymph nodes present throughout mesentery and retroperitoneum which are not enlarged but more numerous than would expect.   Labs 06/06/10: WBC 18,300, H/H 11.9/34.9, MCV 105.1, plt 188,000, sed rate 92, K 3.2, Cre 0.34, Tbili 2.4, AP 275, AST 167, ALT 28, alb 2.3, TSH 1.468, vit B12 >2000.  Current Medications (verified): 1)  Fluoxetine Hcl 20 Mg Caps (Fluoxetine Hcl) ....  Take 1 Tablet By Mouth Once A Day 2)  Omeprazole 20 Mg Cpdr (Omeprazole) .... Take 1 Tablet By Mouth Once A Day 3)  Potassium .... One Tablet Daily 4)  Allegra 180 Mg Tabs (Fexofenadine Hcl) .... Take 1 Tablet By Mouth Once A Day  Allergies (verified): 1)  ! Penicillin  Past History:  Past Medical History: Chronic chest congestion/cough GERD Depression Anxiety Colonoscopy, ten years ago, not sure why she had it.  Past Surgical History: None  Family History: Father, lung cancer Mat aunt, metastatic cancer Mother, breast cancer No FH of CRC. No liver disease.  Social History: Married. No children. Unemployed. 2 cigs per day. Four to six 12 ounce per day. Used to drink 12 per day. Drinking for about six months. Hospitalized for detox 11/2 years ago. At that time had been drinking heavily for years. No drug use.   Review of Systems General:  Complains of anorexia, weakness, and weight loss; denies fever, chills, sweats, and fatigue. Eyes:  Denies vision loss. ENT:  Complains of difficulty swallowing; denies sore throat and hoarseness. CV:  Complains of dyspnea on exertion; denies chest pains, angina, palpitations, and peripheral edema. Resp:  Complains of dyspnea with exercise, cough, sputum, and wheezing; denies dyspnea at rest. GI:  See HPI. GU:  Denies urinary burning and blood in urine. MS:  Denies joint pain / LOM. Derm:  Denies rash and itching. Neuro:  Complains of weakness; denies frequent headaches, memory loss, and confusion. Psych:  Complains of depression and anxiety; denies suicidal ideation, hallucinations, and confusion. Endo:  Complains of unusual weight change. Heme:  Denies bruising  and bleeding. Allergy:  Denies hives and rash.  Vital Signs:  Patient profile:   45 year old female Height:      63.5 inches Weight:      117 pounds BMI:     20.47 Temp:     99.2 degrees F oral Pulse rate:   88 / minute BP sitting:   110 / 80  (left arm) Cuff size:    regular  Vitals Entered By: Cloria Spring LPN (June 16, 2010 11:52 AM)  Physical Exam  General:  Thin WF who appears older than stated age. NAD Head:  Normocephalic and atraumatic. Eyes:  sclera nonicteric Mouth:  Oropharyngeal mucosa moist, pink.  No lesions, erythema or exudate.    Neck:  Supple; no masses or thyromegaly. Lungs:  Rhochi/crackle right lung Heart:  Regular rate and rhythm; no murmurs, rubs,  or bruits. Abdomen:  Soft. Slightly distended. Shifting dullness to percussion. Liver edge easily palpable. Abd NT. No hernia, abd bruit, mass. No rebound or guarding. Rectal:  deferred until time of colonoscopy.   Extremities:  No clubbing, cyanosis, edema or deformities noted. Neurologic:  Alert and  oriented x4;  grossly normal neurologically. Skin:  Intact without significant lesions or rashes. Cervical Nodes:  No significant cervical adenopathy. Psych:  Alert and cooperative. Normal mood and affect.  Impression & Recommendations:  Problem # 1:  TRANSAMINASES, SERUM, ELEVATED (ICD-790.4) Abnormal LFTs in setting of chronic alcohol abuse. Pattern most c/w alcohol hepatitis. She continues to drink daily. She desires to stop. Will provide with Librium for potential withdrawal symptoms. Discussed dosing with Dr. Jena Gauss. Will also r/o other causes of acute liver disease, r/o viral hepatitis. I stressed importance of alcohol cessation immediately.  Orders: T-Hepatitis B Surface Antigen [phone] T-Hepatitis C Antibody ([id number]) T-Hepatitis A Antibody (29562-13086) T-AMA [phone]) T-ANA [phone]) T-Anti SMA (02725-36644) T-Ceruloplasmin [phone]) T-Alpha-1-Antitrypsin Tot (38756-43329) T-Immunoglobulins, Quantitative (51884)  Problem # 2:  COUGH (ICD-786.2) Abnormal lung exam today. CXR PA/lateral. She continues to have leukocytosis of unclear etiology. ?secondary to colitis, lung, hepatits.  Orders: Consultation Level IV (16606) T-CBC w/Diff  ([id number])  Problem # 3:  COLITIS (ICD-558.9) Diarrhea for six weeks. Blood in stool. Multiple rounds of recent abx use. Colitis on CT. ?IBD or infectious.C.Diff is on differential. ?cause of leukocytosis. Discussed with Dr. Jena Gauss. Hold on Flagyl for now. Obtain stool studies. Also TCS in near future in OR given her h/o alcohol abuse. Risks, alternatives, and benefits including but not limited to the risk of reaction to medication, bleeding, infection, and perforation were addressed.  Patient voiced understanding and provided verbal consent.  Orders: Consultation Level IV (35573)  Problem # 4:  DYSPHAGIA UNSPECIFIED (ICD-787.20) Solid food dysphagia, denies odynophagia. Chronic GERD controlled. ?esophageal ring. EGD/ED in near future in OR given h/o alcohol abuse. Risks, alternatives, benefits including but not limited to risk of reaction to medications, bleeding, infection, and perforation addressed.  Patient voiced understanding and verbal consent obtained.  Orders: Consultation Level IV (22025)  Problem # 5:  WEIGHT LOSS, ABNORMAL (ICD-783.21) Abnormal weight loss of over 15 pounds. ?secondary to poor oral intake from alcohol abuse, colitis, or thryoid issues. She has abnormal thryoid on CT. Further imaging recommended by radiology. Further management as per Dr. [name].   Problem # 6:  MACROCYTIC ANEMIA (ICD-281.9) Check folate. TCS/EGD as planned.  Orders: T-Folate (42706)  Problem # 7:  OTHER ASCITES (ICD-789.59) New onset. Mild on exam. Discussed with Dr. Jena Gauss. Monitor for now. No abd u/s with tap yet. If  worsens or develops abd pain, may reconsider. Patient should watch sodium intake.   Other Orders: T-Comprehensive Metabolic Panel [phone]) T-PT (Prothrombin Time) (59563) T-Fecal WBC (87564-33295) T-Stool for O&P (18841-66063) T-Stool Giardia / Crypto- EIA (01601) T-Culture, C-Diff Toxin A/B (09323-55732) Prescriptions: CHLORDIAZEPOXIDE HCL 25 MG CAPS  (CHLORDIAZEPOXIDE HCL) one by mouth every 4 hours as needed tremors, anxiety or other alcohol withdrawal symptoms  #30 x 0   Entered and Authorized by:   Leanna Battles. Dixon Boos   Signed by:   Leanna Battles Dayanara Sherrill PA-C on 06/16/2010   Method used:   Print then Give to Patient   RxID:   [id number]  I would like to thank Dr. [name] for allowing [place] to take part in the care of this nice patient.

## 2010-12-27 NOTE — Letter (Signed)
Summary: x-ray order-ulltrasound guided parasentesis  x-ray order-ulltrasound guided parasentesis   Imported By: Rosine Beat 07/22/2010 14:44:37  _____________________________________________________________________  External Attachment:    Type:   Image     Comment:   External Document

## 2010-12-27 NOTE — Letter (Signed)
Summary: tap orders  tap orders   Imported By: Hendricks Limes LPN 16/08/9603 54:09:81  _____________________________________________________________________  External Attachment:    Type:   Image     Comment:   External Document

## 2010-12-27 NOTE — Letter (Signed)
Summary: CONSULTATION  CONSULTATION   Imported By: Rexene Alberts 08/12/2010 11:24:57  _____________________________________________________________________  External Attachment:    Type:   Image     Comment:   External Document

## 2010-12-27 NOTE — Progress Notes (Signed)
Summary: potassium refill  Phone Note Call from Patient Call back at Home Phone [phone]   Caller: Patient Summary of Call: pt left voicemail- we doubled her potassium ( see 06/16/10) bloodwork. now pt is out of potassium and pharmacy wont refill without new rx. pt needs new rx sent to pharmacy. Tried to call pt to get name of pharmacy and got no answer. Initial call taken by: Hendricks Limes LPN,  July 20, 2010 9:24 AM     Appended Document: potassium refill Patient inpatient right now. Hold off because dose may change.

## 2010-12-27 NOTE — Miscellaneous (Signed)
Summary: Hepatic Function Tests  /06/29/2010  Clinical Lists Changes       L-Hepatic Function Panel (HFP / LFT) - STATUS: Final                                            Perform Date: 3 Aug11 09:00  Ordered By: Jena Gauss MD , Gerrit Friends           Ordered Date: 3 Aug11 08:44                                       Last Updated Date: 3 Aug11 09:47  Facility: [facility]                               Department: GENL  Accession #: [id number]                     USN:       [id number]  Findings  Result Name                              Result     Abnl   Normal Range     Units      Perf. Loc.  Bilirubin, Total                         3.7        h      0.3-1.2          mg/dL  Bilirubin, Direct                        1.8        h      0.0-0.3          mg/dL  Indirect Bilirubin                       1.9        h      0.3-0.9          mg/dL  Alkaline Phosphatase                     206        h      39-117           U/L  SGOT (AST)                               118        h      0-37             U/L  SGPT (ALT)                               33                0-35             U/L  Total  Protein  8.6        h      6.0-8.3          g/dL  Albumin-Blood                            1.4        l      3.5-5.2          g/dL  Additional Information  HL7 RESULT STATUS : F  External IF Update Timestamp : 2010-06-29:09:43:00.000000  Appended Document: Hepatic Function Tests  /06/29/2010 improved; followup as outlined  Appended Document: Hepatic Function Tests  /06/29/2010 LMOM to call.  Appended Document: Hepatic Function Tests  /06/29/2010 Letter mailed with the above info to pt.

## 2010-12-27 NOTE — Progress Notes (Signed)
Summary: phone note/ pt needs additional labs  Phone Note Outgoing Call   Call placed by: Kru Allman Call placed to: Patient Summary of Call: I called and left message on VM, pt needs labs early today. Initial call taken by: Cloria Spring LPN,  June 23, 2010 8:38 AM

## 2010-12-27 NOTE — Letter (Signed)
Summary: LABS  LABS   Imported By: Rexene Alberts 06/29/2010 14:30:52  _____________________________________________________________________  External Attachment:    Type:   Image     Comment:   External Document

## 2010-12-27 NOTE — Miscellaneous (Signed)
Summary: ascitic fluid culture FINAL  Culture, Body Fluid(Bottle) - STATUS: Final  SEE NOTE.                                 Perform Date: 5 Aug11 10:30  Ordered By: Jena Gauss MD , Gerrit Friends           Ordered Date: 5 Aug11 13:02  Facility: [facility]                               Department: MICR  Service Report Text     SPECIMEN OBTAINED:            07/01/2010 10:30  SPECIMEN DESCRIPTION:         FLUID                                ASCITIC  SPECIAL REQUESTS:             BOTTLES DRAWN AEROBIC AND ANAEROBIC                                10CC EACH  CULTURE:                      NO GROWTH 5 DAYS  REPORT STATUS:                FINAL [id number]  Additional Information  HL7 RESULT STATUS : F  External IF Update Timestamp : 2010-07-06:11:59:00.000000 Clinical Lists Changes  Appended Document: ascitic fluid culture FINAL Please let pt know her fluid culture was negative. Also see fluid cytology and other results addendums.  Appended Document: ascitic fluid culture FINAL Pt informed.

## 2010-12-27 NOTE — Letter (Signed)
Summary: Scheduled Appointment  [facility]  [address]   [place], [place] [address]   Phone: [phone]  Fax: [phone]    July 12, 2010   Dear: Katrina Roberts            DOB: [birth year]    I have been instructed to schedule you an appointment in our office.  Your appointment is as follows:   Date:       July 26, 2010   Time:     4 PM    Please be here 15 minutes early.   [name]:          DR Jena Gauss    Please contact the office if you need to reschedule this appointment for a more convenient time.   Thank you,    Diana Eves       [facility] Ph: [phone]   Fax: [phone]

## 2010-12-27 NOTE — Progress Notes (Signed)
Summary: swelling, jaundice, breathing difficulties  Phone Note Call from Patient Call back at [phone]   Caller: Other Relative- sister Gloriajean Dell Summary of Call: pts sister called- pt is having abd and R leg swelling, jaundice and breathing difficulties. last TAP was on 07/01/2010. she can barely get out of the chair. Wants to know what can they do. please advise Initial call taken by: Hendricks Limes LPN,  July 15, 2010 8:23 AM     Appended Document: swelling, jaundice, breathing difficulties schedule tap w radiology for 8/22  Appended Document: swelling, jaundice, breathing difficulties what do you want [place] to do about pts shortness of breath? send to ED?  Appended Document: swelling, jaundice, breathing difficulties if she is having breathing difficulties, yes, she should go to ED  Appended Document: swelling, jaundice, breathing difficulties pts sister aware, tap scheduled for Monday 07/18/2010 at 12.   Appended Document: swelling, jaundice, breathing difficulties What labs do you want ordered on the fluid?  Appended Document: swelling, jaundice, breathing difficulties anareobic/aerobic culture, cell count and diff  Appended Document: swelling, jaundice, breathing difficulties done

## 2010-12-27 NOTE — Letter (Signed)
Summary: Normal Results Letter  [facility]  [address]   [place], [place] [address]   Phone: [phone]  Fax: [phone]    July 20, 2010  Katrina Roberts [address] [place], [place]  [address] 1966/07/21   Dear Ms. Katrina Roberts,   Our office has been trying to contact you.  We just wanted to inform you that  your liver tests results have improved. Please follow instructions as previously outlined. If you have any questions, please call us at [phone].   Thank you,    Hendricks Limes, LPN Cloria Spring, LPN  [facility] Ph: [phone]   Fax: [phone]

## 2010-12-27 NOTE — Assessment & Plan Note (Signed)
Summary: weight check  Nurse Visit   Vital Signs:  Patient profile:   45 year old female Weight:      125 pounds  Vitals Entered By: Hendricks Limes LPN (June 21, 2010 1:01 PM)  Allergies: 1)  ! Penicillin  Appended Document: weight check Patient seen briefly. Her abd is tight. Her weight up eight pounds in five days. Trace pedal edema. Lungs clear. Still drinking. Plans to get Librium on Thursday, didn't have the money. Plans to drop off stools tomorrow.   Let's arrange for U/S guided abdominal paracentesis for diagnostic and therapeutic purposes.  Send fluid for cell count with diff, gram stain, cultures in blood culture bottles (aerobic and anaerobic), cytology.  Please have her labs drawn tomorrow while in pre-op, please send them order (labs that were already planned for tomorrow).   Start aldactone 25mg  by mouth daily, #30, ORF. See RX.    Prescriptions: SPIRONOLACTONE 25 MG TABS (SPIRONOLACTONE) one by mouth daily  #30 x 0   Entered and Authorized by:   Leanna Battles. Dixon Boos   Signed by:   Leanna Battles Lewis PA-C on 06/21/2010   Method used:   Electronically to        [facility]. [phone]* (retail)       [address] [place], [place]  [address]       Ph: [phone]       Fax: [phone]   RxID:   [id number]     Appended Document: weight check Please add pregnancy test, serum qualitative HCG to her preop labs.  Appended Document: weight check Added by [facility], see secondary scanned order.  Appended Document: weight check Preo-op order only has HCG test. Other orders for CMET and PT/INR were not on the first preop, this was to be added. Please have pt go first thing in AM for labs!  Appended Document: weight check Actually, they did do H/H, Met-7.  I still need PT/INR and LFTs.  Appended Document: weight check Lab orders faxed to [facility].  Appended Document: weight check Pt called and she will go this afternoon for labs @  [facility].  Appended Document: weight check Has u/s guided tap been order along with fluid test as requested above on 06/21/10?  Appended Document: weight check tap is scheduled for 07/01/2010 at 10am. pt aware. Radiology needs pt/ptt. will send order.  Appended Document: weight check order faxed to radiology

## 2010-12-27 NOTE — Letter (Signed)
Summary: CONSULTATION  CONSULTATION   Imported By: Rexene Alberts 07/20/2010 11:28:32  _____________________________________________________________________  External Attachment:    Type:   Image     Comment:   External Document

## 2011-02-09 LAB — BASIC METABOLIC PANEL
CO2: 31 mEq/L (ref 19–32)
Calcium: 8.5 mg/dL (ref 8.4–10.5)
Chloride: 106 mEq/L (ref 96–112)
Creatinine, Ser: 0.54 mg/dL (ref 0.4–1.2)
GFR calc Af Amer: >60 mL/min (ref >60)
Glucose, Bld: 86 mg/dL (ref 70–99)
Sodium: 141 mEq/L (ref 135–145)

## 2011-02-09 LAB — DIFFERENTIAL
Basophils Relative: 0 % (ref 0–1)
Basophils Relative: 1 % (ref 0–1)
Eosinophils Absolute: 0 10*3/uL (ref 0.0–0.7)
Eosinophils Absolute: 0.1 10*3/uL (ref 0.0–0.7)
Eosinophils Relative: 1 % (ref 0–5)
Lymphs Abs: 1.9 10*3/uL (ref 0.7–4.0)
Lymphs Abs: 3.4 10*3/uL (ref 0.7–4.0)
Monocytes Absolute: 1.4 10*3/uL — ABNORMAL HIGH (ref 0.1–1.0)
Monocytes Absolute: 1.4 10*3/uL — ABNORMAL HIGH (ref 0.1–1.0)
Monocytes Relative: 13 % — ABNORMAL HIGH (ref 3–12)
Monocytes Relative: 9 % (ref 3–12)
Neutrophils Relative %: 66 % (ref 43–77)

## 2011-02-09 LAB — CBC
Hemoglobin: 10.2 g/dL — ABNORMAL LOW (ref 12.0–15.0)
MCH: 35 pg — ABNORMAL HIGH (ref 26.0–34.0)
MCH: 35.3 pg — ABNORMAL HIGH (ref 26.0–34.0)
MCHC: 33.1 g/dL (ref 30.0–36.0)
MCHC: 33.5 g/dL (ref 30.0–36.0)
MCV: 105.5 fL — ABNORMAL HIGH (ref 78.0–100.0)
MCV: 105.6 fL — ABNORMAL HIGH (ref 78.0–100.0)
Platelets: 275 10*3/uL (ref 150–400)
RBC: 2.91 MIL/uL — ABNORMAL LOW (ref 3.87–5.11)
RDW: 15.1 % (ref 11.5–15.5)

## 2011-02-09 LAB — COMPREHENSIVE METABOLIC PANEL
ALT: 12 U/L (ref 0–35)
Albumin: 3.1 g/dL — ABNORMAL LOW (ref 3.5–5.2)
Alkaline Phosphatase: 72 U/L (ref 39–117)
Calcium: 8.4 mg/dL (ref 8.4–10.5)
GFR calc Af Amer: >60 mL/min (ref >60)
Potassium: 2.7 mEq/L — CL (ref 3.5–5.1)
Sodium: 141 mEq/L (ref 135–145)
Total Protein: 6.1 g/dL (ref 6.0–8.3)

## 2011-02-10 LAB — CLOSTRIDIUM DIFFICILE EIA: C difficile Toxins A+B, EIA: NEGATIVE

## 2011-02-10 LAB — COMPREHENSIVE METABOLIC PANEL
ALT: 17 U/L (ref 0–35)
ALT: 27 U/L (ref 0–35)
AST: 29 U/L (ref 0–37)
AST: 48 U/L — ABNORMAL HIGH (ref 0–37)
Albumin: 2 g/dL — ABNORMAL LOW (ref 3.5–5.2)
Alkaline Phosphatase: 103 U/L (ref 39–117)
Alkaline Phosphatase: 85 U/L (ref 39–117)
BUN: 5 mg/dL — ABNORMAL LOW (ref 6–23)
CO2: 25 mEq/L (ref 19–32)
CO2: 27 mEq/L (ref 19–32)
Calcium: 8 mg/dL — ABNORMAL LOW (ref 8.4–10.5)
Calcium: 8.6 mg/dL (ref 8.4–10.5)
Chloride: 105 mEq/L (ref 96–112)
Chloride: 99 mEq/L (ref 96–112)
Creatinine, Ser: 0.55 mg/dL (ref 0.4–1.2)
GFR calc Af Amer: >60 mL/min (ref >60)
GFR calc Af Amer: >60 mL/min (ref >60)
GFR calc non Af Amer: >60 mL/min (ref >60)
GFR calc non Af Amer: >60 mL/min (ref >60)
Glucose, Bld: 113 mg/dL — ABNORMAL HIGH (ref 70–99)
Potassium: 3 mEq/L — ABNORMAL LOW (ref 3.5–5.1)
Potassium: 3.4 mEq/L — ABNORMAL LOW (ref 3.5–5.1)
Sodium: 134 mEq/L — ABNORMAL LOW (ref 135–145)
Sodium: 137 mEq/L (ref 135–145)
Total Bilirubin: 0.8 mg/dL (ref 0.3–1.2)
Total Bilirubin: 1 mg/dL (ref 0.3–1.2)
Total Protein: 7.4 g/dL (ref 6.0–8.3)

## 2011-02-10 LAB — URINALYSIS, ROUTINE W REFLEX MICROSCOPIC
Glucose, UA: NEGATIVE mg/dL
Ketones, ur: NEGATIVE mg/dL
Nitrite: NEGATIVE
Protein, ur: NEGATIVE mg/dL
Specific Gravity, Urine: <1.005 — ABNORMAL LOW (ref 1.005–1.030)
Urobilinogen, UA: 0.2 mg/dL (ref 0.0–1.0)
pH: 6.5 (ref 5.0–8.0)

## 2011-02-10 LAB — BASIC METABOLIC PANEL
BUN: 3 mg/dL — ABNORMAL LOW (ref 6–23)
BUN: 6 mg/dL (ref 6–23)
CO2: 26 mEq/L (ref 19–32)
Chloride: 103 mEq/L (ref 96–112)
Creatinine, Ser: 0.44 mg/dL (ref 0.4–1.2)
Creatinine, Ser: 0.5 mg/dL (ref 0.4–1.2)
GFR calc non Af Amer: >60 mL/min (ref >60)
Glucose, Bld: 129 mg/dL — ABNORMAL HIGH (ref 70–99)
Potassium: 3.2 mEq/L — ABNORMAL LOW (ref 3.5–5.1)
Potassium: 3.4 mEq/L — ABNORMAL LOW (ref 3.5–5.1)

## 2011-02-10 LAB — DIFFERENTIAL
Basophils Absolute: 0 10*3/uL (ref 0.0–0.1)
Basophils Absolute: 0 10*3/uL (ref 0.0–0.1)
Basophils Absolute: 0.2 10*3/uL — ABNORMAL HIGH (ref 0.0–0.1)
Basophils Relative: 1 % (ref 0–1)
Eosinophils Absolute: 0.1 10*3/uL (ref 0.0–0.7)
Eosinophils Absolute: 0.1 10*3/uL (ref 0.0–0.7)
Eosinophils Absolute: 0.1 10*3/uL (ref 0.0–0.7)
Eosinophils Relative: 0 % (ref 0–5)
Eosinophils Relative: 1 % (ref 0–5)
Eosinophils Relative: 1 % (ref 0–5)
Lymphocytes Relative: 16 % (ref 12–46)
Lymphocytes Relative: 17 % (ref 12–46)
Lymphocytes Relative: 8 % — ABNORMAL LOW (ref 12–46)
Lymphs Abs: 0.4 10*3/uL — ABNORMAL LOW (ref 0.7–4.0)
Lymphs Abs: 1.9 10*3/uL (ref 0.7–4.0)
Monocytes Absolute: 0.6 10*3/uL (ref 0.1–1.0)
Monocytes Absolute: 1.2 10*3/uL — ABNORMAL HIGH (ref 0.1–1.0)
Monocytes Relative: 4 % (ref 3–12)
Neutro Abs: 10 10*3/uL — ABNORMAL HIGH (ref 1.7–7.7)
Neutro Abs: 14 10*3/uL — ABNORMAL HIGH (ref 1.7–7.7)
Neutrophils Relative %: 73 % (ref 43–77)
Neutrophils Relative %: 93 % — ABNORMAL HIGH (ref 43–77)

## 2011-02-10 LAB — CBC
HCT: 30.5 % — ABNORMAL LOW (ref 36.0–46.0)
HCT: 30.8 % — ABNORMAL LOW (ref 36.0–46.0)
HCT: 34.9 % — ABNORMAL LOW (ref 36.0–46.0)
Hemoglobin: 11.4 g/dL — ABNORMAL LOW (ref 12.0–15.0)
Hemoglobin: 9.7 g/dL — ABNORMAL LOW (ref 12.0–15.0)
MCH: 35.2 pg — ABNORMAL HIGH (ref 26.0–34.0)
MCH: 35.7 pg — ABNORMAL HIGH (ref 26.0–34.0)
MCHC: 33.7 g/dL (ref 30.0–36.0)
MCV: 105.9 fL — ABNORMAL HIGH (ref 78.0–100.0)
MCV: 106 fL — ABNORMAL HIGH (ref 78.0–100.0)
Platelets: 293 10*3/uL (ref 150–400)
Platelets: 300 10*3/uL (ref 150–400)
Platelets: 325 10*3/uL (ref 150–400)
RBC: 2.69 MIL/uL — ABNORMAL LOW (ref 3.87–5.11)
RBC: 3.24 MIL/uL — ABNORMAL LOW (ref 3.87–5.11)
RDW: 14.9 % (ref 11.5–15.5)
RDW: 15.1 % (ref 11.5–15.5)
RDW: 16.2 % — ABNORMAL HIGH (ref 11.5–15.5)
WBC: 13.6 10*3/uL — ABNORMAL HIGH (ref 4.0–10.5)

## 2011-02-10 LAB — BODY FLUID CELL COUNT WITH DIFFERENTIAL
Eos, Fluid: 0 %
Lymphs, Fluid: 1 %
Lymphs, Fluid: 2 %
Lymphs, Fluid: 9 %
Monocyte-Macrophage-Serous Fluid: 52 % (ref 50–90)
Monocyte-Macrophage-Serous Fluid: 57 % (ref 50–90)
Monocyte-Macrophage-Serous Fluid: 59 % (ref 50–90)
Neutrophil Count, Fluid: 34 % — ABNORMAL HIGH (ref 0–25)
Neutrophil Count, Fluid: 40 % — ABNORMAL HIGH (ref 0–25)
Neutrophil Count, Fluid: 46 % — ABNORMAL HIGH (ref 0–25)
Total Nucleated Cell Count, Fluid: 155 cu mm (ref 0–1000)
Total Nucleated Cell Count, Fluid: 220 cu mm (ref 0–1000)

## 2011-02-10 LAB — RETICULOCYTES
RBC.: 2.69 MIL/uL — ABNORMAL LOW (ref 3.87–5.11)
Retic Count, Absolute: 48.4 10*3/uL (ref 19.0–186.0)

## 2011-02-10 LAB — ANAEROBIC CULTURE

## 2011-02-10 LAB — PROTIME-INR
INR: 1.16 (ref 0.00–1.49)
INR: 1.52 — ABNORMAL HIGH (ref 0.00–1.49)
Prothrombin Time: 18.5 seconds — ABNORMAL HIGH (ref 11.6–15.2)

## 2011-02-10 LAB — OVA AND PARASITE EXAMINATION: Ova and parasites: NONE SEEN

## 2011-02-10 LAB — WOUND CULTURE: Gram Stain: NONE SEEN

## 2011-02-10 LAB — FERRITIN: Ferritin: 584 ng/mL — ABNORMAL HIGH (ref 10–291)

## 2011-02-10 LAB — FECAL LACTOFERRIN, QUANT: Fecal Lactoferrin: NEGATIVE

## 2011-02-10 LAB — AMMONIA: Ammonia: 21 umol/L (ref 11–35)

## 2011-02-10 LAB — STOOL CULTURE

## 2011-02-10 LAB — BODY FLUID CULTURE
Culture: NO GROWTH
Gram Stain: NONE SEEN
Gram Stain: NONE SEEN

## 2011-02-10 LAB — CULTURE, BODY FLUID W GRAM STAIN -BOTTLE
Culture: NO GROWTH
Report Status: 8102011

## 2011-02-10 LAB — HEPATIC FUNCTION PANEL
AST: 118 U/L — ABNORMAL HIGH (ref 0–37)
Albumin: 1.4 g/dL — ABNORMAL LOW (ref 3.5–5.2)
Total Protein: 8.6 g/dL — ABNORMAL HIGH (ref 6.0–8.3)

## 2011-02-10 LAB — IRON AND TIBC: Iron: 43 ug/dL (ref 42–135)

## 2011-02-10 LAB — VITAMIN B12: Vitamin B-12: 937 pg/mL — ABNORMAL HIGH (ref 211–911)

## 2011-02-10 LAB — VANCOMYCIN, RANDOM: Vancomycin Rm: 10.7 ug/mL

## 2011-02-11 LAB — BASIC METABOLIC PANEL
BUN: 1 mg/dL — ABNORMAL LOW (ref 6–23)
CO2: 20 mEq/L (ref 19–32)
Calcium: 8 mg/dL — ABNORMAL LOW (ref 8.4–10.5)
Chloride: 95 mEq/L — ABNORMAL LOW (ref 96–112)
Creatinine, Ser: 0.46 mg/dL (ref 0.4–1.2)
GFR calc Af Amer: >60 mL/min (ref >60)
Glucose, Bld: 85 mg/dL (ref 70–99)

## 2011-02-11 LAB — HEMOGLOBIN AND HEMATOCRIT, BLOOD: Hemoglobin: 12.1 g/dL (ref 12.0–15.0)

## 2011-04-11 NOTE — H&P (Signed)
NAME:  Katrina Roberts, Katrina Roberts NO.:  [id number]   MEDICAL RECORD NO.:  [id number]          PATIENT TYPE:  IPS   LOCATION:  0503                          FACILITY:  [facility]   PHYSICIAN:  Geoffery Lyons, M.D.      DATE OF BIRTH:  09/11/1966   DATE OF ADMISSION:  12/24/2007  DATE OF DISCHARGE:                       PSYCHIATRIC ADMISSION ASSESSMENT   This is a 45 year old female voluntarily admitted on December 24, 2007.   HISTORY OF PRESENT ILLNESS:  The patient presents with a history of  alcohol abuse.  She has been drinking up to 9-12 beers at the time,  drinks alone at home.  She has been off work for 2 years.  Her last  drink was yesterday.  She has had a decreased appetite with a weight  loss of approximately 10 pounds. Her sleep has been decreased.  She does  feel depressed but denies any suicidal thoughts.  She is motivated to  remain off alcohol by trying to obtain some volunteer work and future  employment.  She has a supportive family and she said that one of the  reasons that she is here is she was told she had problems with her liver  enzymes and had a go see a neurologist and he related her  problems from  her alcohol use.   PAST PSYCHIATRIC HISTORY:  This is her first admission to [facility], does not see a counselor or  psychiatrist currently.   SOCIAL HISTORY:  She is a 45 year old married female, has no children.  She is unemployed but used to work at [employer].  No legal  problems.   FAMILY HISTORY:  None.   ALCOHOL DRUG HISTORY:  She denies any drug use.  No seizures, no  blackouts, again has been drinking anywhere from 9-12 12-ounce beers.   MEDICAL HISTORY:  Primary care Benay Pomeroy is Dr. [name].  Medical  problems:  Denies any known medical problems.   MEDICATIONS:  Has been on lorazepam, scheduled to take four a day but  normally does not take that amount, no other medications.   DRUG ALLERGIES:  PENICILLIN where she has  problems getting bloated.   PHYSICAL EXAM:  The patient was assessed at [facility].  Her  temperature is 97.8,104 heart rate, 18 respirations, blood pressure  126/82, 5 feet 4 inches tall, 114 pounds.   LABORATORY DATA:  Hemoglobin of 15.9, hematocrit of 44.4, platelet count  of 161.  Alcohol level 289, BUN 2, pregnancy test is negative.  Urine  drug screen is negative.   MENTAL STATUS EXAM:  She is fully alert, cooperative, good eye contact,  casually dressed.  Her speech is clear, normal pace and tone.  The  patient's mood is neutral.  The patient affect:  She appears mildly  anxious.  Thought process are coherent and goal directed, cognitive  function intact.  Her memory is good.  Judgment and insight is good.   AXIS I:  Alcohol dependence, depressive disorder NOS.  AXIS II:  Deferred.  AXIS III:  No known medical problems.  AXIS IV:  Is problems with  occupation.  AXIS V:  Current is 45.   PLAN:  To detox the patient with the Librium protocol, work on relapse  prevention.  Will initiate Celexa.  Risk and benefits were discussed.  The patient is agreeable to beginning medications.  Will have Ambien for  sleep, consider family session with her husband.  Case manager will  obtain her follow-up.  Her tentative length of stay is 3-5 days.      Landry Corporal, N.P.      Geoffery Lyons, M.D.  Electronically Signed    JO/MEDQ  D:  12/25/2007  T:  12/25/2007  Job:  [id number]

## 2011-04-11 NOTE — Discharge Summary (Signed)
NAME:  Katrina Roberts, Katrina Roberts NO.:  [id number]   MEDICAL RECORD NO.:  [id number]          PATIENT TYPE:  IPS   LOCATION:  0503                          [facility]ACILITY:  [facility]   PHYSICIAN:  Geoffery Lyons, M.D.      DATE OF BIRTH:  08-24-1966   DATE OF ADMISSION:  12/24/2007  DATE OF DISCHARGE:  12/28/2007                               DISCHARGE SUMMARY   CHIEF COMPLAINT/PRESENT ILLNESS:  This was the first admission to [facility] for this 45 year old female voluntarily admitted.  History of alcohol abuse, drinking 9 to 12 beers, drinking alone at  home.  She has been off work for 2 years.  Last drink was the day  before, decreased appetite with loss of 10 pounds, decreased sleep,  feeling depressed but denied any suicidal thoughts, motivated to remain  off alcohol, trying to abstain, trying to obtain some volunteer work and  further and future employment.   PAST PSYCHIATRIC HISTORY:  [facility] at [facility].   ALCOHOL AND DRUG HISTORY:  Persistent use of alcohol, drinking anywhere  from 9 to 12  12-ounce beers, no other substances.   MEDICAL HISTORY:  Noncontributory.   MEDICATIONS:  Had been given lorazepam.  Scheduled to take four a day,  but endorsed that she does not take it like that.   PHYSICAL EXAMINATION:  Failed to show any acute findings.   LABORATORY WORK:  Hemoglobin 15.9, alcohol level 289, BUN 2, pregnancy  test negative.  UDS negative for substances of abuse.   MENTAL STATUS EXAM:  Reveals a fully alert cooperative female, good eye  contact, casually dressed.  Speech was clear, normal in pace tone and  production.  Mood was depressed, affect constricted.  Thought process  was logical, coherent and relevant.  Denies any active suicidal or  homicidal ideas.  There is no evidence of any delusions.  No  hallucinations.  Cognition well-preserved.   DIAGNOSES:  Axis I:  Alcohol abuse, rule out dependence.  Depressive  disorder not otherwise  specified.  Axis II:  No diagnosis.  Axis III:  No diagnosis.  Axis IV:  Moderate.  Per admission, 35, 40.  Her GAF in the last year  83.   COURSE IN THE HOSPITAL:  She was admitted, started in individual and  group psychotherapy.  We detoxified with Librium.  We had discontinued  the Ativan, and she was started on Celexa 10 mg per day.  As already  stated, she had been unemployed for two years, increased depression,  worried, unable to find a job.  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 feeding in front  of the TV, drinking beer.  She did endorse that she worked towards  staying sober for 3 months.  She still was depressed, and she drank  again, was being given Xanax for anxiety and then recently given Ativan.  She apparently went to the grocery store every day, and gets the beer,  endorsed decreased appetite, being sad, crying, decreased energy,  decreased motivation  and weight loss.  We continued the detox, went  uneventfully.  Just by coming off the alcohol and able to open up and  talk, she endorsed she was already feeling better, insightful, wanting  to get her life back together, endorsed that she could not go back to  the way things were before.  She was encouraged and motivated.  We  pursued the detox, relapse prevention, endorsed she was committed to  make this happen.  Was wanting to make a plan of how to get more active,  get out, be actively looking for a job, be involved in lieu of staying  in the house just watching TV and drinking.  We continued to work on  stabilization, will wrap up the detox.  There was a family session  January 31, endorsed that she was going to volunteer, where she is  trying to find a job, aware that she needed to do things, have people  involved, have people in her life.  She was going to have followup in  place.  By January 31, she was in full contact with reality.  There were  no active  suicidal or homicidal ideation, no hallucinations or  delusions.  She was encouraged, she was motivated, felt much better.   We went ahead and discharged to outpatient follow-up.   DISCHARGE DIAGNOSES:  Axis I:  Major depressive disorder, alcohol  dependence.  Axis II:  No diagnosis.  Axis III:  No diagnosis.  Axis IV:  Moderate.  Axis V:  Upon discharge,  55-60.   Discharged on Celexa 20 mg per day.  Follow with [facility] outpatient clinic in [place].      Geoffery Lyons, M.D.  Electronically Signed     IL/MEDQ  D:  01/27/2008  T:  01/27/2008  Job:  [id number]

## 2011-08-17 LAB — RAPID URINE DRUG SCREEN, HOSP PERFORMED
Amphetamines: NOT DETECTED
Benzodiazepines: NOT DETECTED
Cocaine: NOT DETECTED
Opiates: NOT DETECTED
Tetrahydrocannabinol: NOT DETECTED

## 2011-08-17 LAB — CBC
Hemoglobin: 15.9 — ABNORMAL HIGH
MCHC: 35.9
RBC: 4.52
WBC: 4.4

## 2011-08-17 LAB — ETHANOL: Alcohol, Ethyl (B): 289 — ABNORMAL HIGH

## 2011-08-17 LAB — DIFFERENTIAL
Basophils Relative: 1
Lymphocytes Relative: 18
Lymphs Abs: 0.8
Monocytes Absolute: 0.7
Monocytes Relative: 16 — ABNORMAL HIGH
Neutro Abs: 2.8
Neutrophils Relative %: 64

## 2011-08-17 LAB — HEPATIC FUNCTION PANEL
AST: 129 — ABNORMAL HIGH
Albumin: 3.7
Bilirubin, Direct: <0.1
Total Bilirubin: 0.4

## 2011-08-17 LAB — URINALYSIS, ROUTINE W REFLEX MICROSCOPIC
Bilirubin Urine: NEGATIVE
Hgb urine dipstick: NEGATIVE
Ketones, ur: NEGATIVE
Nitrite: NEGATIVE
Specific Gravity, Urine: <1.005 — ABNORMAL LOW
Urobilinogen, UA: 0.2

## 2011-08-17 LAB — BASIC METABOLIC PANEL
CO2: 24
Calcium: 9.3
Creatinine, Ser: 0.46
GFR calc Af Amer: >60
Sodium: 137

## 2011-08-17 LAB — PREGNANCY, URINE: Preg Test, Ur: NEGATIVE

## 2012-01-19 ENCOUNTER — Encounter (HOSPITAL_COMMUNITY): Payer: Self-pay | Admitting: *Deleted

## 2012-01-19 ENCOUNTER — Inpatient Hospital Stay (HOSPITAL_COMMUNITY)
Admission: AD | Admit: 2012-01-19 | Discharge: 2012-01-27 | DRG: 433 | Disposition: A | Discharge status: Home / Self Care | Payer: Managed Care, Other (non HMO) | Source: Ambulatory Visit | Attending: Family Medicine | Admitting: Family Medicine

## 2012-01-19 ENCOUNTER — Inpatient Hospital Stay (HOSPITAL_COMMUNITY): Payer: Managed Care, Other (non HMO)

## 2012-01-19 DIAGNOSIS — Z23 Encounter for immunization: Secondary | ICD-10-CM

## 2012-01-19 DIAGNOSIS — K319 Disease of stomach and duodenum, unspecified: Secondary | ICD-10-CM | POA: Diagnosis present

## 2012-01-19 DIAGNOSIS — F17200 Nicotine dependence, unspecified, uncomplicated: Secondary | ICD-10-CM | POA: Diagnosis present

## 2012-01-19 DIAGNOSIS — R188 Other ascites: Secondary | ICD-10-CM | POA: Diagnosis present

## 2012-01-19 DIAGNOSIS — F1020 Alcohol dependence, uncomplicated: Secondary | ICD-10-CM | POA: Diagnosis present

## 2012-01-19 DIAGNOSIS — E871 Hypo-osmolality and hyponatremia: Secondary | ICD-10-CM | POA: Diagnosis present

## 2012-01-19 DIAGNOSIS — E8809 Other disorders of plasma-protein metabolism, not elsewhere classified: Secondary | ICD-10-CM | POA: Diagnosis present

## 2012-01-19 DIAGNOSIS — K7010 Alcoholic hepatitis without ascites: Principal | ICD-10-CM | POA: Diagnosis present

## 2012-01-19 DIAGNOSIS — K449 Diaphragmatic hernia without obstruction or gangrene: Secondary | ICD-10-CM | POA: Diagnosis present

## 2012-01-19 DIAGNOSIS — E46 Unspecified protein-calorie malnutrition: Secondary | ICD-10-CM | POA: Diagnosis present

## 2012-01-19 DIAGNOSIS — F411 Generalized anxiety disorder: Secondary | ICD-10-CM | POA: Diagnosis present

## 2012-01-19 DIAGNOSIS — K2100 Gastro-esophageal reflux disease with esophagitis, without bleeding: Secondary | ICD-10-CM | POA: Diagnosis present

## 2012-01-19 DIAGNOSIS — D6489 Other specified anemias: Secondary | ICD-10-CM | POA: Diagnosis present

## 2012-01-19 DIAGNOSIS — K7030 Alcoholic cirrhosis of liver without ascites: Secondary | ICD-10-CM | POA: Diagnosis present

## 2012-01-19 DIAGNOSIS — F329 Major depressive disorder, single episode, unspecified: Secondary | ICD-10-CM | POA: Diagnosis present

## 2012-01-19 DIAGNOSIS — K2960 Other gastritis without bleeding: Secondary | ICD-10-CM | POA: Diagnosis present

## 2012-01-19 DIAGNOSIS — F3289 Other specified depressive episodes: Secondary | ICD-10-CM | POA: Diagnosis present

## 2012-01-19 HISTORY — DX: Anxiety disorder, unspecified: F41.9

## 2012-01-19 HISTORY — DX: Unspecified cirrhosis of liver: K74.60

## 2012-01-19 HISTORY — DX: Headache: R51

## 2012-01-19 HISTORY — DX: Nicotine dependence, unspecified, uncomplicated: F17.200

## 2012-01-19 HISTORY — DX: Major depressive disorder, single episode, unspecified: F32.9

## 2012-01-19 HISTORY — DX: Gastro-esophageal reflux disease without esophagitis: K21.9

## 2012-01-19 HISTORY — DX: Depression, unspecified: F32.A

## 2012-01-19 LAB — COMPREHENSIVE METABOLIC PANEL
Albumin: 1.6 g/dL — ABNORMAL LOW (ref 3.5–5.2)
BUN: 7 mg/dL (ref 6–23)
Calcium: 8 mg/dL — ABNORMAL LOW (ref 8.4–10.5)
GFR calc Af Amer: >90 mL/min (ref >90)
Glucose, Bld: 112 mg/dL — ABNORMAL HIGH (ref 70–99)
Sodium: 111 mEq/L — CL (ref 135–145)
Total Protein: 8.4 g/dL — ABNORMAL HIGH (ref 6.0–8.3)

## 2012-01-19 LAB — CBC
MCH: 32.7 pg (ref 26.0–34.0)
MCHC: 34.7 g/dL (ref 30.0–36.0)
MCV: 94 fL (ref 78.0–100.0)
Platelets: 248 10*3/uL (ref 150–400)
RBC: 2.51 MIL/uL — ABNORMAL LOW (ref 3.87–5.11)

## 2012-01-19 LAB — DIFFERENTIAL
Basophils Relative: 0 % (ref 0–1)
Eosinophils Absolute: 0 10*3/uL (ref 0.0–0.7)
Eosinophils Relative: 0 % (ref 0–5)
Lymphs Abs: 1.5 10*3/uL (ref 0.7–4.0)
Monocytes Relative: 11 % (ref 3–12)

## 2012-01-19 LAB — LIPID PANEL
Cholesterol: 132 mg/dL (ref 0–200)
HDL: 12 mg/dL — ABNORMAL LOW (ref >39)
Total CHOL/HDL Ratio: 11 RATIO

## 2012-01-19 LAB — PROTIME-INR
INR: 1.76 — ABNORMAL HIGH (ref 0.00–1.49)
Prothrombin Time: 20.8 seconds — ABNORMAL HIGH (ref 11.6–15.2)

## 2012-01-19 MED ORDER — PNEUMOCOCCAL VAC POLYVALENT 25 MCG/0.5ML IJ INJ
0.5000 mL | INJECTION | INTRAMUSCULAR | Status: AC
  Filled 2012-01-19: qty 0.5

## 2012-01-19 MED ORDER — INFLUENZA VIRUS VACC SPLIT PF IM SUSP
0.5000 mL | INTRAMUSCULAR | Status: AC
  Administered 2012-01-20: 0.5 mL via INTRAMUSCULAR
  Filled 2012-01-19: qty 0.5

## 2012-01-19 MED ORDER — SODIUM CHLORIDE 0.9 % IJ SOLN
INTRAMUSCULAR | Status: AC
  Filled 2012-01-19: qty 3

## 2012-01-19 MED ORDER — LORAZEPAM 2 MG/ML IJ SOLN: 1.0000 mg | INTRAMUSCULAR | Status: DC | PRN

## 2012-01-19 MED ORDER — SPIRONOLACTONE 25 MG PO TABS
25.0000 mg | ORAL_TABLET | Freq: Two times a day (BID) | ORAL | Status: DC
  Administered 2012-01-19 – 2012-01-20 (×2): 25 mg via ORAL
  Filled 2012-01-19 (×2): qty 1

## 2012-01-19 MED ORDER — SODIUM CHLORIDE 0.9 % IV SOLN
INTRAVENOUS | Status: DC
  Administered 2012-01-19 – 2012-01-20 (×2): via INTRAVENOUS

## 2012-01-19 MED ORDER — PANTOPRAZOLE SODIUM 40 MG IV SOLR
40.0000 mg | Freq: Two times a day (BID) | INTRAVENOUS | Status: DC
  Administered 2012-01-19 – 2012-01-23 (×9): 40 mg via INTRAVENOUS
  Filled 2012-01-19 (×9): qty 40

## 2012-01-19 MED ORDER — POTASSIUM CHLORIDE 10 MEQ/100ML IV SOLN
10.0000 meq | INTRAVENOUS | Status: AC
  Administered 2012-01-19 (×4): 10 meq via INTRAVENOUS
  Filled 2012-01-19: qty 400

## 2012-01-19 MED ORDER — FUROSEMIDE 10 MG/ML IJ SOLN
40.0000 mg | Freq: Every day | INTRAMUSCULAR | Status: DC
  Administered 2012-01-19: 40 mg via INTRAVENOUS
  Filled 2012-01-19: qty 4

## 2012-01-19 MED ORDER — IBUPROFEN 800 MG PO TABS
400.0000 mg | ORAL_TABLET | ORAL | Status: DC | PRN
  Administered 2012-01-19 – 2012-01-20 (×2): 400 mg via ORAL
  Filled 2012-01-19: qty 1
  Filled 2012-01-19: qty 2

## 2012-01-19 NOTE — H&P (Signed)
NAMELAURANNE, Katrina Roberts NO.:  [id number]  MEDICAL RECORD NO.:  [id number]  LOCATION:  A327                          FACILITY:  [facility]  PHYSICIAN:  Mila Homer. [name], M.D.DATE OF BIRTH:  Jun 18, 1966  DATE OF ADMISSION:  01/19/2012 DATE OF DISCHARGE:  LH                             HISTORY & PHYSICAL   This 46 year old woman presented to the office today.  She had been last seen about 14 months ago.  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  Unfortunately, she resumed drinking on her birthday, November 16, 2009, and found she could not stay with just 1 drink but then started drinking, according to her, 4 to 6 beers a day.  Two weeks ago, she began to develop swelling of her abdomen and came in to see [place] today.  She also says she has been coughing for the last 2 weeks, but mainly a clear sputum.  She denies fever and chills.  She says she has been vomiting some with coughing but says also that she has not been eating or drinking.  She is currently married.  Her mother lives in town.  She has had a long history of ethyl alcohol abuse.  She is currently on no home medication.  PHYSICAL EXAMINATION:  Exam in my office showed a middle-aged woman who had icterus.  Her sclerae were yellowed.  Her speech was normal.  Her sensorium appeared to be normal as well.  Her heart had a regular rhythm, rate of about 100.  Her lungs appeared clear throughout.  She was moving air well.  Her abdomen was massively distended with ascitic fluid.  She had an area of erythema on the distal left leg anteriorly, which almost looked like a scrape.  Her admission white blood cell count was 17,500, of which 80% neutrophils, 9 lymphs.  Her hemoglobin was 8.2.  Her serum sodium was 111, chloride 76, potassium 2.8, alk phos 269, AST 118, ALT 27.  Her albumin was 1.6.  Total bilirubin was 6.6.   Lipid profile showed an HDL of 12, LDL of 103, triglycerides 84.  Chest x-ray was done, but results are not ready at this time.  ADMISSION DIAGNOSES: 1. Cirrhosis with massive ascites. 2. Chronic ethyl alcohol abuse. 3. Electrolyte abnormalities, including severe hyponatremia and     hypochloremia as well as hypokalemia. 4. Alcoholic hepatitis. 5. Anemia, probably of multifactorial cause, but some may be from     gastrointestinal bleeding. She is admitted to the hospital on IV fluids.  I am replacing her potassium, and we will have her on Protonix IV.  I have asked nursing to watch for signs of DTs, although she has never had these before.  She will be on lorazepam p.r.n. for this.  I will get a GI consult.  We will recheck her CBC and CMP in the morning.     Mila Homer. [name], M.D.     SDK/MEDQ  D:  01/19/2012  T:  01/19/2012  Job:  [id number]

## 2012-01-20 DIAGNOSIS — K7010 Alcoholic hepatitis without ascites: Secondary | ICD-10-CM

## 2012-01-20 DIAGNOSIS — R188 Other ascites: Secondary | ICD-10-CM

## 2012-01-20 LAB — URINALYSIS, ROUTINE W REFLEX MICROSCOPIC
Leukocytes, UA: NEGATIVE
Nitrite: NEGATIVE
Specific Gravity, Urine: 1.01 (ref 1.005–1.030)
Urobilinogen, UA: 1 mg/dL (ref 0.0–1.0)

## 2012-01-20 LAB — COMPREHENSIVE METABOLIC PANEL
AST: 96 U/L — ABNORMAL HIGH (ref 0–37)
Albumin: 1.4 g/dL — ABNORMAL LOW (ref 3.5–5.2)
Chloride: 80 mEq/L — ABNORMAL LOW (ref 96–112)
Creatinine, Ser: 0.82 mg/dL (ref 0.50–1.10)
Total Bilirubin: 5.7 mg/dL — ABNORMAL HIGH (ref 0.3–1.2)
Total Protein: 7.3 g/dL (ref 6.0–8.3)

## 2012-01-20 LAB — CBC
MCHC: 34.9 g/dL (ref 30.0–36.0)
MCV: 93.6 fL (ref 78.0–100.0)
Platelets: 226 10*3/uL (ref 150–400)
RDW: 15.6 % — ABNORMAL HIGH (ref 11.5–15.5)
WBC: 15.6 10*3/uL — ABNORMAL HIGH (ref 4.0–10.5)

## 2012-01-20 MED ORDER — POTASSIUM CHLORIDE 10 MEQ/100ML IV SOLN
10.0000 meq | INTRAVENOUS | Status: AC
  Administered 2012-01-20 (×4): 10 meq via INTRAVENOUS
  Filled 2012-01-20 (×4): qty 100

## 2012-01-20 MED ORDER — FUROSEMIDE 10 MG/ML IJ SOLN: 40.0000 mg | Freq: Two times a day (BID) | INTRAMUSCULAR | Status: DC

## 2012-01-20 MED ORDER — POTASSIUM CHLORIDE CRYS ER 20 MEQ PO TBCR
20.0000 meq | EXTENDED_RELEASE_TABLET | Freq: Two times a day (BID) | ORAL | Status: DC
  Administered 2012-01-20 – 2012-01-27 (×15): 20 meq via ORAL
  Filled 2012-01-20 (×15): qty 1

## 2012-01-20 MED ORDER — CIPROFLOXACIN IN D5W 200 MG/100ML IV SOLN
200.0000 mg | Freq: Two times a day (BID) | INTRAVENOUS | Status: DC
  Administered 2012-01-20 – 2012-01-26 (×11): 200 mg via INTRAVENOUS
  Filled 2012-01-20 (×12): qty 100

## 2012-01-20 MED ORDER — VITAMIN K1 10 MG/ML IJ SOLN
10.0000 mg | Freq: Once | INTRAMUSCULAR | Status: AC
  Administered 2012-01-20: 10 mg via SUBCUTANEOUS
  Filled 2012-01-20: qty 1

## 2012-01-20 MED ORDER — THIAMINE HCL 100 MG/ML IJ SOLN
Freq: Once | INTRAVENOUS | Status: AC
  Administered 2012-01-20: 19:00:00 via INTRAVENOUS
  Filled 2012-01-20: qty 1000

## 2012-01-20 NOTE — Progress Notes (Signed)
Subjective: She was admitted yesterday with cirrhosis of the liver and ascites.. Her PT/INR was elevated a low and is a little bit better.. her weight has gone up  Objective: Vital signs in last 24 hours: Temp:  [97.6 F (36.4 C)-98 F (36.7 C)] 97.7 F (36.5 C) (02/23 0539) Pulse Rate:  [107-121] 121  (02/23 0539) Resp:  [18-20] 20  (02/23 0539) BP: (99-123)/(65-79) 117/74 mmHg (02/23 0539) SpO2:  [97 %-99 %] 98 % (02/23 0539) Weight:  [67.246 kg (148 lb 4 oz)-72.8 kg (160 lb 7.9 oz)] 72.8 kg (160 lb 7.9 oz) (02/23 1004) Weight change:  Last BM Date: 01/19/12  Intake/Output from previous day:    PHYSICAL EXAM General appearance: alert, cooperative and no distress Resp: clear to auscultation bilaterally Cardio: regular rate and rhythm, S1, S2 normal, no murmur, click, rub or gallop GI: I cannot feel her liver but she does have marked ascites Extremities: edema 2+  Lab Results:    Basic Metabolic Panel:  Basename 01/20/12 0617 01/19/12 1900  NA 112* 111*  K 3.1* 2.8*  CL 80* 76*  CO2 20 22  GLUCOSE 90 112*  BUN 7 7  CREATININE 0.82 0.69  CALCIUM 7.6* 8.0*  MG -- --  PHOS -- --   Liver Function Tests:  [facility] 01/20/12 0617 01/19/12 1900  AST 96* 118*  ALT 22 27  ALKPHOS 238* 269*  BILITOT 5.7* 6.6*  PROT 7.3 8.4*  ALBUMIN 1.4* 1.6*   No results found for this basename: LIPASE:2,AMYLASE:2 in the last 72 hours No results found for this basename: AMMONIA:2 in the last 72 hours CBC:  Basename 01/20/12 0617 01/19/12 1900  WBC 15.6* 17.5*  NEUTROABS -- 14.0*  HGB 7.6* 8.2*  HCT 21.8* 23.6*  MCV 93.6 94.0  PLT 226 248   Cardiac Enzymes: No results found for this basename: CKTOTAL:3,CKMB:3,CKMBINDEX:3,TROPONINI:3 in the last 72 hours BNP: No results found for this basename: PROBNP:3 in the last 72 hours D-Dimer: No results found for this basename: DDIMER:2 in the last 72 hours CBG: No results found for this basename: GLUCAP:6 in the last 72  hours Hemoglobin A1C: No results found for this basename: HGBA1C in the last 72 hours Fasting Lipid Panel:  Basename 01/19/12 1900  CHOL 132  HDL 12*  LDLCALC 103*  TRIG 84  CHOLHDL 11.0  LDLDIRECT --   Thyroid Function Tests: No results found for this basename: TSH,T4TOTAL,FREET4,T3FREE,THYROIDAB in the last 72 hours Anemia Panel: No results found for this basename: VITAMINB12,FOLATE,FERRITIN,TIBC,IRON,RETICCTPCT in the last 72 hours Coagulation:  Basename 01/19/12 1900  LABPROT 20.8*  INR 1.76*   Urine Drug Screen: Drugs of Abuse     Component Value Date/Time   LABOPIA NONE DETECTED 12/24/2007 1222   COCAINSCRNUR NONE DETECTED 12/24/2007 1222   LABBENZ NONE DETECTED 12/24/2007 1222   AMPHETMU NONE DETECTED 12/24/2007 1222   THCU NONE DETECTED 12/24/2007 1222   LABBARB  Value: NONE DETECTED        DRUG SCREEN FOR MEDICAL PURPOSES ONLY.  IF CONFIRMATION IS NEEDED FOR ANY PURPOSE, NOTIFY LAB WITHIN 5 DAYS. 12/24/2007 1222    Alcohol Level: No results found for this basename: ETH:2 in the last 72 hours Urinalysis: No results found for this basename: COLORURINE:2,APPERANCEUR:2,LABSPEC:2,PHURINE:2,GLUCOSEU:2,HGBUR:2,BILIRUBINUR:2,KETONESUR:2,PROTEINUR:2,UROBILINOGEN:2,NITRITE:2,LEUKOCYTESUR:2 in the last 72 hours Misc. Labs:  ABGS No results found for this basename: PHART,PCO2,PO2ART,TCO2,HCO3 in the last 72 hours CULTURES No results found for this or any previous visit (from the past 240 hour(s)). Studies/Results: Dg Chest 2 View  01/19/2012  *RADIOLOGY  REPORT*  Clinical Data: 46 year old female with cough, congestion, weakness.  CHEST - 2 VIEW  Comparison: 06/16/2010 and earlier.  Findings: Small moderate right pleural effusion is new.  Associated right lung base atelectasis.  No pneumothorax, pulmonary edema or definite consolidation.  Cardiac size and mediastinal contours are within normal limits.  Visualized tracheal air column is within normal limits.  No acute osseous  abnormality identified.  IMPRESSION: Small to moderate right pleural effusion with atelectasis is new. It is difficult to exclude superimposed right lung base infection.  Original Report Authenticated By: Harley Hallmark, M.D.    Medications:  Prior to Admission:  No prescriptions prior to admission   Scheduled:   . furosemide  40 mg Intravenous Daily  . influenza  inactive virus vaccine  0.5 mL Intramuscular Tomorrow-1000  . pantoprazole (PROTONIX) IV  40 mg Intravenous Q12H  . pneumococcal 23 valent vaccine  0.5 mL Intramuscular Tomorrow-1000  . potassium chloride  10 mEq Intravenous Q1 Hr x 4  . banana bag IV fluid 1000 mL   Intravenous Once  . sodium chloride      . sodium chloride      . spironolactone  25 mg Oral BID   Continuous:   . 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 She has marked ascites. She gained weight. Her sodium is still low. Her potassium is still low. Active Problems:  * No active hospital problems. *     Plan: I think we need to put her on somewhat lower IV fluid rate I'm going to give her more Lasix. I will continue with all of her other treatments but had multiple vitamins etc. She is set for GI consultation.    LOS: 1 day   Rawson Minix L 01/20/2012, 10:24 AM

## 2012-01-20 NOTE — Consult Note (Signed)
Referring Provider: No ref. provider found Primary Care Physician:  Milana Obey, MD, MD Primary Gastroenterologist:  Dr.  Jena Gauss  Reason for Consultation:  Ascites  HPI:   46 year old lady with long-standing alcohol abuse admitted to the hospital with progressive anasarca. She was found to be severely hyponatremic yesterday with a sodium of 111. Dr. [name] called me last evening and asked me to see her today. The patient has been given normal saline challenge overnight. Her sodium this morning is 112. She has gained 12 pounds since her admission weight. Accurate urine output not documented. Outpatient diuretics held. She also reports a tarry black stool several episodes of the days leading to her admission but none overnight. No hematemesis. She has been coughing. She denies abdominal pain she denies hematochezia; hemoglobin 12.2-7.2 this admission. White count 15.6.  We evaluated her in 2011 for diarrhea and dysphagia. EGD demonstrated a Schatzki's ring which was dilated. She did not have any evidence of portal gastropathy or esophageal varices. CT of the abdomen previously demonstrated fatty liver or spleen was not enlarged. No evidence of varices.  Colonoscopy demonstrated hyperemia, nonspecific inflammation of the colon and terminal ileum. Currently, patient states her baseline is occasional diarrhea in the setting of otherwise normal bowel function. No infection was ever documented on prior stool studies. She has not had a diagnosis of inflammatory bowel disease. Prior liver evaluation included a positive anti-smooth muscle antibody and elevated immunoglobulins by report in the medical record.  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  Past Medical History  Diagnosis Date  . Anxiety   . Depression   . Smoker     No past surgical history on file.  Prior to Admission medications   Medication Sig Start Date End Date  Taking? Authorizing Provider  ibuprofen (ADVIL,MOTRIN) 200 MG tablet Take 200 mg by mouth daily.   Yes Historical Provider, MD    Current Facility-Administered Medications  Medication Dose Route Frequency Provider Last Rate Last Dose  . furosemide (LASIX) injection 40 mg  40 mg Intravenous BID Fredirick Maudlin, MD      . ibuprofen (ADVIL,MOTRIN) tablet 400 mg  400 mg Oral Q4H PRN Milana Obey, MD   400 mg at 01/19/12 2208  . influenza  inactive virus vaccine (FLUZONE/FLUARIX) injection 0.5 mL  0.5 mL Intramuscular Tomorrow-1000 Milana Obey, MD   0.5 mL at 01/20/12 1058  . LORazepam (ATIVAN) injection 1 mg  1 mg Intravenous Q4H PRN Milana Obey, MD      . pantoprazole (PROTONIX) injection 40 mg  40 mg Intravenous Q12H Milana Obey, MD   40 mg at 01/20/12 1058  . pneumococcal 23 valent vaccine (PNU-IMMUNE) injection 0.5 mL  0.5 mL Intramuscular Tomorrow-1000 Milana Obey, MD      . potassium chloride 10 mEq in 100 mL IVPB  10 mEq Intravenous Q1 Hr x 4 Milana Obey, MD   10 mEq at 01/19/12 2351  . potassium chloride 10 mEq in 100 mL IVPB  10 mEq Intravenous Q1 Hr x 4 Fredirick Maudlin, MD   10 mEq at 01/20/12 1058  . potassium chloride SA (K-DUR,KLOR-CON) CR tablet 20 mEq  20 mEq Oral BID Fredirick Maudlin, MD   20 mEq at 01/20/12 1058  . sodium chloride 0.9 % 1,000 mL with thiamine 100 mg, folic acid 1 mg, multivitamins adult 10 mL infusion   Intravenous Once Fredirick Maudlin, MD      .  sodium chloride 0.9 % injection           . sodium chloride 0.9 % injection           . spironolactone (ALDACTONE) tablet 25 mg  25 mg Oral BID Milana Obey, MD   25 mg at 01/20/12 0801  . DISCONTD: 0.9 %  sodium chloride infusion   Intravenous Continuous Milana Obey, MD 125 mL/hr at 01/20/12 [phone]    . DISCONTD: furosemide (LASIX) injection 40 mg  40 mg Intravenous Daily Milana Obey, MD   40 mg at 01/19/12 2044    Allergies as of 01/19/2012 - Review Complete  01/19/2012  Allergen Reaction Noted  . Penicillins      No family history on file.  History   Social History  . Marital Status: Married    Spouse Name: N/A    Number of Children: N/A  . Years of Education: N/A   Occupational History  . Not on file.   Social History Main Topics  . Smoking status: Current Everyday Smoker -- 0.5 packs/day  . Smokeless tobacco: Not on file  . Alcohol Use: 3.6 oz/week    6 Cans of beer per week  . Drug Use: Not on file  . Sexually Active: Yes    Birth Control/ Protection: Post-menopausal   Other Topics Concern  . Not on file   Social History Narrative  . No narrative on file    Review of Systems: Gen: Denies any fever, chills, sweats, CV: Denies chest pain, angina, palpitations, syncope, orthopnea, PND, peripheral edema, and claudication. Resp: Denies dyspnea at rest, dyspnea with exercise, cough, sputum, wheezing, coughing up blood, and pleurisy. GI: Denies vomiting blood,    Denies dysphagia or odynophagia. Derm: Denies rash, itching, dry skin, hives, moles, warts, or unhealing ulcers.  Psych: memory loss, suicidal ideation, hallucinations, paranoia, and confusion. Heme: Denies bruising, and enlarged lymph nodes.   Physical Exam: Vital signs in last 24 hours: Temp:  [97.6 F (36.4 C)-98 F (36.7 C)] 97.7 F (36.5 C) (02/23 0539) Pulse Rate:  [107-121] 121  (02/23 0539) Resp:  [18-20] 20  (02/23 0539) BP: (99-123)/(65-79) 117/74 mmHg (02/23 0539) SpO2:  [97 %-99 %] 98 % (02/23 0539) Weight:  [148 lb 4 oz (67.246 kg)-160 lb 7.9 oz (72.8 kg)] 160 lb 7.9 oz (72.8 kg) (02/23 1004) Last BM Date: 01/19/12 General:   Alert,  pleasant and cooperative in NAD. Well oriented. Accompanied by her mother. Head:  Normocephalic and atraumatic. Eyes:  Sclera icterus present.   Conjunctiva pink. Ears:  Normal auditory acuity. Nose:  No deformity, discharge,  or lesions. Mouth:  No deformity or lesions, dentition normal. Neck:  Supple; no masses  or thyromegaly. Lungs:  Clear throughout to auscultation.   No wheezes, crackles, or rhonchi. No acute distress. Heart:  Regular rate and rhythm; no murmurs, clicks, rubs,  or gallops. Abdomen: Distended with a prominent superficial venous pattern present. Positive bowel sounds she does shifting                           dullness and a fluid wave. No obvious mass or organomegaly. The abdomen is nontender.  Rectal:  Deferred until time of colonoscopy.   Msk:  Symmetrical without gross deformities. Normal posture. Pulses:  Normal pulses noted. Extremities: 1+ edema Neurologic:  Alert and  oriented x4;  grossly normal neurologically. Skin:  Intact without significant lesions or rashes. Cervical Nodes:  No  significant cervical adenopathy. Psych:  Alert and cooperative. Normal mood and affect.  Lab Results:  [facility] 01/20/12 0617 01/19/12 1900  WBC 15.6* 17.5*  HGB 7.6* 8.2*  HCT 21.8* 23.6*  PLT 226 248   BMET  Basename 01/20/12 0617 01/19/12 1900  NA 112* 111*  K 3.1* 2.8*  CL 80* 76*  CO2 20 22  GLUCOSE 90 112*  BUN 7 7  CREATININE 0.82 0.69  CALCIUM 7.6* 8.0*   LFT  Basename 01/20/12 0617  PROT 7.3  ALBUMIN 1.4*  AST 96*  ALT 22  ALKPHOS 238*  BILITOT 5.7*  BILIDIR --  IBILI --   PT/INR  Basename 01/19/12 1900  LABPROT 20.8*  INR 1.76*  Studies/Results: Dg Chest 2 View  01/19/2012  *RADIOLOGY REPORT*  Clinical Data: 46 year old female with cough, congestion, weakness.  CHEST - 2 VIEW  Comparison: 06/16/2010 and earlier.  Findings: Small moderate right pleural effusion is new.  Associated right lung base atelectasis.  No pneumothorax, pulmonary edema or definite consolidation.  Cardiac size and mediastinal contours are within normal limits.  Visualized tracheal air column is within normal limits.  No acute osseous abnormality identified.  IMPRESSION: Small to moderate right pleural effusion with atelectasis is new. It is difficult to exclude superimposed right lung  base infection.  Original Report Authenticated By: Harley Hallmark, M.D.    Impression: Unfortunate 46 year old lady suffered from alcoholism admitted to the hospital with decompensation. She has anasarca. Clinically, she does have significant ascites. She has marked hyponatremia. She appears to be hypervolemic.   She also has hypokalemia. She's gained 12 pounds overnight with fluid challenge. Diuretics have been stopped. Documented urine output thus far unreliable.  Melena by history. Significant anemia. No overt GI bleeding noted during this hospitalization.  She is not febrile. She has a mildly elevated white count. She does not appear to be septic or infected at this time. She may benefit from a paracentesis however, significant correction of her hyponatremia needs to occur before she undergoes a large volume paracentesis.  She most likely has acute alcoholic hepatitis superimposed on protein calorie malnutrition. She can easily have underlying cirrhosis with preserved hepatic synthetic function. It is worth noting that her platelet count is surprisingly good. She does not have splenomegaly on her most recent ultrasound/CT scans and did not have any esophageal varices or portal gastropathy on her EGD in 2011.    Discriminatory function is greater than 32 which makes her a potential of prednisolone candidate. However, GI bleeding would be a contraindication to this therapy.  Recommendations: Daily weights. Strict I and O's. Place a Foley to monitor urine output accurately (patient is agreeable).  Hemoccult stool. Consider IV antibiotic therapy empirically. Agree with discontinuing diuretics for now.  Cut down on total fluid intake to 1200 cc per 24 hours for the time being. We'll give a single dose of vitamin K and recheck her INR tomorrow. Follow the BMET.  It would be best to avoid nonsteroidal agents in this clinical setting.  Overall prognosis quite guarded. I would like to find Dr.Knowlton  for allowing me to see this nice lady once again.     LOS: 1 day   Eula Listen  01/20/2012, 11:13 AM

## 2012-01-21 LAB — DIFFERENTIAL
Basophils Absolute: 0 10*3/uL (ref 0.0–0.1)
Basophils Relative: 0 % (ref 0–1)
Eosinophils Absolute: 0.1 10*3/uL (ref 0.0–0.7)
Lymphocytes Relative: 12 % (ref 12–46)
Monocytes Absolute: 1.6 10*3/uL — ABNORMAL HIGH (ref 0.1–1.0)
Neutrophils Relative %: 76 % (ref 43–77)

## 2012-01-21 LAB — CBC
MCH: 32.6 pg (ref 26.0–34.0)
MCHC: 34.3 g/dL (ref 30.0–36.0)
Platelets: 213 10*3/uL (ref 150–400)
RDW: 15.9 % — ABNORMAL HIGH (ref 11.5–15.5)

## 2012-01-21 LAB — COMPREHENSIVE METABOLIC PANEL
ALT: 21 U/L (ref 0–35)
AST: 99 U/L — ABNORMAL HIGH (ref 0–37)
Alkaline Phosphatase: 234 U/L — ABNORMAL HIGH (ref 39–117)
CO2: 21 mEq/L (ref 19–32)
Calcium: 7.7 mg/dL — ABNORMAL LOW (ref 8.4–10.5)
Chloride: 86 mEq/L — ABNORMAL LOW (ref 96–112)
GFR calc Af Amer: >90 mL/min (ref >90)
GFR calc non Af Amer: 84 mL/min — ABNORMAL LOW (ref >90)
Glucose, Bld: 83 mg/dL (ref 70–99)
Potassium: 4.1 mEq/L (ref 3.5–5.1)
Sodium: 115 mEq/L — CL (ref 135–145)
Total Bilirubin: 6.1 mg/dL — ABNORMAL HIGH (ref 0.3–1.2)

## 2012-01-21 LAB — PROTIME-INR: Prothrombin Time: 21.7 seconds — ABNORMAL HIGH (ref 11.6–15.2)

## 2012-01-21 LAB — OCCULT BLOOD X 1 CARD TO LAB, STOOL: Fecal Occult Bld: NEGATIVE

## 2012-01-21 MED ORDER — FUROSEMIDE 10 MG/ML IJ SOLN
40.0000 mg | Freq: Once | INTRAMUSCULAR | Status: AC
  Administered 2012-01-21: 40 mg via INTRAVENOUS
  Filled 2012-01-21: qty 4

## 2012-01-21 MED ORDER — SODIUM CHLORIDE 0.9 % IJ SOLN
INTRAMUSCULAR | Status: AC
  Administered 2012-01-21: 10 mL
  Filled 2012-01-21: qty 3

## 2012-01-21 MED ORDER — ALBUMIN HUMAN 25 % IV SOLN
25.0000 g | Freq: Once | INTRAVENOUS | Status: AC
  Administered 2012-01-21: 25 g via INTRAVENOUS
  Filled 2012-01-21: qty 100

## 2012-01-21 NOTE — Progress Notes (Signed)
Subjective: She says she feels better. She has no new complaints. She would like to have her Foley catheter out. They help from Dr. Jena Gauss is noted and appreciated. She is more alert and awake.  Objective: Vital signs in last 24 hours: Temp:  [98 F (36.7 C)-98.3 F (36.8 C)] 98.2 F (36.8 C) (02/24 0425) Pulse Rate:  [103-114] 105  (02/24 0425) Resp:  [18-20] 18  (02/24 0425) BP: (94-108)/(59-69) 94/60 mmHg (02/24 0425) SpO2:  [95 %-99 %] 99 % (02/24 0425) Weight:  [72.8 kg (160 lb 7.9 oz)] 72.8 kg (160 lb 7.9 oz) (02/23 1004) Weight change: 5.554 kg (12 lb 3.9 oz) Last BM Date: 01/19/12  Intake/Output from previous day: 02/23 0701 - 02/24 0700 In: -  Out: 650 [Urine:650]  PHYSICAL EXAM General appearance: alert, cooperative and mild distress Resp: clear to auscultation bilaterally Cardio: regular rate and rhythm, S1, S2 normal, no murmur, click, rub or gallop GI: Swollen and with definite ascites, bowel sounds present and active Extremities: extremities normal, atraumatic, no cyanosis or edema  Lab Results:    Basic Metabolic Panel:  Basename 01/21/12 0611 01/20/12 0617  NA 115* 112*  K 4.1 3.1*  CL 86* 80*  CO2 21 20  GLUCOSE 83 90  BUN 9 7  CREATININE 0.84 0.82  CALCIUM 7.7* 7.6*  MG -- --  PHOS -- --   Liver Function Tests:  [facility] 01/21/12 0611 01/20/12 0617  AST 99* 96*  ALT 21 22  ALKPHOS 234* 238*  BILITOT 6.1* 5.7*  PROT 7.1 7.3  ALBUMIN 1.3* 1.4*   No results found for this basename: LIPASE:2,AMYLASE:2 in the last 72 hours No results found for this basename: AMMONIA:2 in the last 72 hours CBC:  Basename 01/21/12 0611 01/20/12 0617 01/19/12 1900  WBC 14.1* 15.6* --  NEUTROABS 10.7* -- 14.0*  HGB 7.3* 7.6* --  HCT 21.3* 21.8* --  MCV 95.1 93.6 --  PLT 213 226 --   Cardiac Enzymes: No results found for this basename: CKTOTAL:3,CKMB:3,CKMBINDEX:3,TROPONINI:3 in the last 72 hours BNP: No results found for this basename: PROBNP:3 in the  last 72 hours D-Dimer: No results found for this basename: DDIMER:2 in the last 72 hours CBG: No results found for this basename: GLUCAP:6 in the last 72 hours Hemoglobin A1C: No results found for this basename: HGBA1C in the last 72 hours Fasting Lipid Panel:  Basename 01/19/12 1900  CHOL 132  HDL 12*  LDLCALC 103*  TRIG 84  CHOLHDL 11.0  LDLDIRECT --   Thyroid Function Tests: No results found for this basename: TSH,T4TOTAL,FREET4,T3FREE,THYROIDAB in the last 72 hours Anemia Panel: No results found for this basename: VITAMINB12,FOLATE,FERRITIN,TIBC,IRON,RETICCTPCT in the last 72 hours Coagulation:  Basename 01/21/12 0611 01/19/12 1900  LABPROT 21.7* 20.8*  INR 1.85* 1.76*   Urine Drug Screen: Drugs of Abuse     Component Value Date/Time   LABOPIA NONE DETECTED 12/24/2007 1222   COCAINSCRNUR NONE DETECTED 12/24/2007 1222   LABBENZ NONE DETECTED 12/24/2007 1222   AMPHETMU NONE DETECTED 12/24/2007 1222   THCU NONE DETECTED 12/24/2007 1222   LABBARB  Value: NONE DETECTED        DRUG SCREEN FOR MEDICAL PURPOSES ONLY.  IF CONFIRMATION IS NEEDED FOR ANY PURPOSE, NOTIFY LAB WITHIN 5 DAYS. 12/24/2007 1222    Alcohol Level: No results found for this basename: ETH:2 in the last 72 hours Urinalysis:  Basename 01/20/12 1651  COLORURINE AMBER*  LABSPEC 1.010  PHURINE 6.0  GLUCOSEU NEGATIVE  HGBUR NEGATIVE  BILIRUBINUR  LARGE*  KETONESUR TRACE*  PROTEINUR NEGATIVE  UROBILINOGEN 1.0  NITRITE NEGATIVE  LEUKOCYTESUR NEGATIVE   Misc. Labs:  ABGS No results found for this basename: PHART,PCO2,PO2ART,TCO2,HCO3 in the last 72 hours CULTURES No results found for this or any previous visit (from the past 240 hour(s)). Studies/Results: Dg Chest 2 View  01/19/2012  *RADIOLOGY REPORT*  Clinical Data: 46 year old female with cough, congestion, weakness.  CHEST - 2 VIEW  Comparison: 06/16/2010 and earlier.  Findings: Small moderate right pleural effusion is new.  Associated right lung  base atelectasis.  No pneumothorax, pulmonary edema or definite consolidation.  Cardiac size and mediastinal contours are within normal limits.  Visualized tracheal air column is within normal limits.  No acute osseous abnormality identified.  IMPRESSION: Small to moderate right pleural effusion with atelectasis is new. It is difficult to exclude superimposed right lung base infection.  Original Report Authenticated By: Harley Hallmark, M.D.    Medications:  Scheduled:   . ciprofloxacin  200 mg Intravenous Q12H  . influenza  inactive virus vaccine  0.5 mL Intramuscular Tomorrow-1000  . pantoprazole (PROTONIX) IV  40 mg Intravenous Q12H  . phytonadione  10 mg Subcutaneous Once  . pneumococcal 23 valent vaccine  0.5 mL Intramuscular Tomorrow-1000  . potassium chloride  10 mEq Intravenous Q1 Hr x 4  . potassium chloride  20 mEq Oral BID  . banana bag IV fluid 1000 mL   Intravenous Once  . sodium chloride      . sodium chloride      . sodium chloride      . DISCONTD: furosemide  40 mg Intravenous Daily  . DISCONTD: furosemide  40 mg Intravenous BID  . DISCONTD: spironolactone  25 mg Oral BID   Continuous:   . DISCONTD: sodium chloride 125 mL/hr at 01/20/12 0332   ZOX:[id number], DISCONTD: ibuprofen  Assesment: She has what appears to be alcoholic hepatitis. Her hemoglobin level has dropped some further. We're trying to get stools for blood. She has marked ascites. She was markedly hyponatremic and that is improving slowly. She looks better in general despite all of the problems. Active Problems:  * No active hospital problems. *     Plan: Discontinue Foley catheter. She may need blood but I'm somewhat concerned about volume with that. She is on fluid restriction I will continue that.    LOS: 2 days   Khayman Kirsch L 01/21/2012, 9:54 AM

## 2012-01-21 NOTE — Progress Notes (Signed)
Subjective: Feels better than yesterday. Foley has been removed. Intake and output as documented. No stool for Hemoccult testing. Patient only complaint is that of abdominal distention.  Objective: Vital signs in last 24 hours: Temp:  [98 F (36.7 C)-98.3 F (36.8 C)] 98.2 F (36.8 C) (02/24 0425) Pulse Rate:  [103-114] 105  (02/24 0425) Resp:  [18-20] 18  (02/24 0425) BP: (94-108)/(59-69) 94/60 mmHg (02/24 0425) SpO2:  [95 %-99 %] 99 % (02/24 0425) Last BM Date: 01/21/12 General:   Alert,  Well-developed, well-nourished, pleasant and cooperative in NAD Abdomen:  Abdomen remains distended but nontender. Fluid wave and shifting dullness present Extremities: 1+ LE edema.    Intake/Output from previous day: 02/23 0701 - 02/24 0700 In: -  Out: 650 [Urine:650] Intake/Output this shift: Total I/O In: 10 [I.V.:10] Out: 650 [Urine:650]  Lab Results:  [facility] 01/21/12 0611 01/20/12 0617 01/19/12 1900  WBC 14.1* 15.6* 17.5*  HGB 7.3* 7.6* 8.2*  HCT 21.3* 21.8* 23.6*  PLT 213 226 248   BMET  Basename 01/21/12 0611 01/20/12 0617 01/19/12 1900  NA 115* 112* 111*  K 4.1 3.1* 2.8*  CL 86* 80* 76*  CO2 21 20 22   GLUCOSE 83 90 112*  BUN 9 7 7   CREATININE 0.84 0.82 0.69  CALCIUM 7.7* 7.6* 8.0*   LFT  Basename 01/21/12 0611  PROT 7.1  ALBUMIN 1.3*  AST 99*  ALT 21  ALKPHOS 234*  BILITOT 6.1*  BILIDIR --  IBILI --   PT/INR  Basename 01/21/12 0611 01/19/12 1900  LABPROT 21.7* 20.8*  INR 1.85* 1.76*   Hepatitis Panel  Basename 01/19/12 1900  HEPBSAG NEGATIVE  HCVAB NEGATIVE  HEPAIGM NEGATIVE  HEPBIGM NEGATIVE   Assessment: Patient remains stable. Sodium inching upward.  No evidence for GI bleeding or infection at this time. Serum albumin very low.  INR has not improved after vitamin K which implies impairment of intrinsic hepatic synthetic function.  Recommendations: We'll give intravenous albumin followed by Lasix today.  Continue fluid restriction.     Followup on Hemoccult; I doubt significant recent GI bleed; anticipate initiation of prednisone              Soon.  May be able to stop antibiotics as well.                                                 Will consider large volume paracentesis but would like to see the serum sodium up to 120 range     before that is done              Discussed with Dr. Juanetta Gosling.  LOS: 2 days   Eula Listen  01/21/2012, 11:05 AM

## 2012-01-22 DIAGNOSIS — K7010 Alcoholic hepatitis without ascites: Secondary | ICD-10-CM

## 2012-01-22 LAB — COMPREHENSIVE METABOLIC PANEL
BUN: 7 mg/dL (ref 6–23)
Calcium: 8.2 mg/dL — ABNORMAL LOW (ref 8.4–10.5)
GFR calc Af Amer: >90 mL/min (ref >90)
Glucose, Bld: 111 mg/dL — ABNORMAL HIGH (ref 70–99)
Sodium: 125 mEq/L — ABNORMAL LOW (ref 135–145)
Total Protein: 7 g/dL (ref 6.0–8.3)

## 2012-01-22 LAB — CBC
HCT: 20.7 % — ABNORMAL LOW (ref 36.0–46.0)
Hemoglobin: 7 g/dL — ABNORMAL LOW (ref 12.0–15.0)
RBC: 2.13 MIL/uL — ABNORMAL LOW (ref 3.87–5.11)
WBC: 11.6 10*3/uL — ABNORMAL HIGH (ref 4.0–10.5)

## 2012-01-22 LAB — DIFFERENTIAL
Lymphocytes Relative: 13 % (ref 12–46)
Monocytes Absolute: 1.5 10*3/uL — ABNORMAL HIGH (ref 0.1–1.0)
Monocytes Relative: 13 % — ABNORMAL HIGH (ref 3–12)
Neutro Abs: 8.5 10*3/uL — ABNORMAL HIGH (ref 1.7–7.7)

## 2012-01-22 MED ORDER — FUROSEMIDE 10 MG/ML IJ SOLN
40.0000 mg | Freq: Once | INTRAMUSCULAR | Status: AC
  Administered 2012-01-22: 40 mg via INTRAVENOUS
  Filled 2012-01-22: qty 4

## 2012-01-22 MED ORDER — ALBUMIN HUMAN 25 % IV SOLN
25.0000 g | Freq: Once | INTRAVENOUS | Status: AC
Start: 2012-01-22 — End: 2012-01-22
  Administered 2012-01-22: 25 g via INTRAVENOUS
  Filled 2012-01-22: qty 100

## 2012-01-22 MED ORDER — PREDNISOLONE 5 MG PO TABS: 30.0000 mg | ORAL_TABLET | Freq: Every day | ORAL | Status: DC

## 2012-01-22 MED ORDER — PREDNISONE 20 MG PO TABS
30.0000 mg | ORAL_TABLET | Freq: Every day | ORAL | Status: DC
  Administered 2012-01-22 – 2012-01-27 (×6): 30 mg via ORAL
  Filled 2012-01-22 (×3): qty 1
  Filled 2012-01-22: qty 3
  Filled 2012-01-22 (×2): qty 1

## 2012-01-22 MED ORDER — SODIUM CHLORIDE 0.9 % IJ SOLN
INTRAMUSCULAR | Status: AC
  Administered 2012-01-22: 10 mL
  Filled 2012-01-22: qty 3

## 2012-01-22 NOTE — Progress Notes (Signed)
NAMEARLIN, Katrina NO.:  [id number]  MEDICAL RECORD NO.:  [id number]  LOCATION:  A327                          FACILITY:  [facility]  PHYSICIAN:  Katrina Roberts, M.D.DATE OF BIRTH:  July 22, 1966  DATE OF PROCEDURE: DATE OF DISCHARGE:                                PROGRESS NOTE   SUBJECTIVE:  This patient feels much better.  She was admitted with massive ascites 2 days ago and feels improved.  She was followed by Dr. Shaune Pollack over the weekend, also seen was seen Dr. Jena Gauss, gastroenterologist.  She required some IV albumin yesterday.  OBJECTIVE:  She is sitting up in the bed, feet on the floor eating.  She really does look better, but she does look somewhat pale.  She is in no acute distress.  Her speech is normal.  Her lungs appear to be clear throughout.  Her heart is a regular rhythm, rate of about 120 sitting and there is no edema of the ankles.  She still has massive ascites. Her abdomen is distended from this. Her white cell count today is 11,600; down from 14,100.  Her hemoglobin was 7.0, down from 7.3.  She has a normal differential.  Her INR has gradually crept up from 1.76 on admission to 1.99.  Sugars seem to be stable.  Fecal occult blood was negative. Serum sodium was 125, up from 115 yesterday and 111 on admission.  Alk phos is 201.  Albumin 1.6 up from 1.3 yesterday.  AST is 94.  ASSESSMENT: 1. Cirrhosis with massive ascites. 2. Chronic ethyl alcohol abuse. 3. Electrolyte abnormalities, improved. 4. Alcoholic hepatitis. 5. Anemia of multifactorial cause.  PLAN:  Continue IV fluids and albumin if needed.  Recheck a CBC, BMP tomorrow.  She may need a transfusion packed red cells eventually.  She may be able to have some of her ascites tapped now that her sodium is over 125, and she appears to be more stable.     Katrina Roberts, M.D.     SDK/MEDQ  D:  01/22/2012  T:  01/22/2012  Job:  [id number]

## 2012-01-22 NOTE — Progress Notes (Signed)
Subjective: No n/v, no signs of gi bleeding, tolerating diet, wants to quit drinking. Heme negative. No wt today. Wt 160 yesterday.  Objective: Vital signs in last 24 hours: Temp:  [98.3 F (36.8 C)-99.6 F (37.6 C)] 98.5 F (36.9 C) (02/25 1626) Pulse Rate:  [102-121] 112  (02/25 1626) Resp:  [18-20] 18  (02/25 1626) BP: (101-119)/(62-76) 108/72 mmHg (02/25 1626) SpO2:  [94 %-99 %] 98 % (02/25 1626) Weight:  [162 lb 3.2 oz (73.573 kg)] 162 lb 3.2 oz (73.573 kg) (02/25 0627) Last BM Date: 01/21/12 General:   Alert and oriented, pleasant Head:  Normocephalic and atraumatic. Eyes:  Question mild scleral icterus Heart:  S1, S2 present, no murmurs noted.  Lungs: Clear to auscultation bilaterally, without wheezing, rales, or rhonchi.  Abdomen:  Bowel sounds present, distended, fluid wave noted, non-tense Msk:  Symmetrical without gross deformities. Normal posture. Extremities:  1-2+ edema pitting to knee Neurologic:  Alert and  oriented x4;  grossly normal neurologically. No asterixis noted Skin:  Warm and dry, intact without significant lesions.  Psych:  Alert and cooperative. Normal mood and affect.  Intake/Output from previous day: 02/24 0701 - 02/25 0700 In: 240 [P.O.:230; I.V.:10] Out: 1550 [Urine:1550] Intake/Output this shift: Total I/O In: 304 [IV Piggyback:304] Out: 701 [Urine:700; Stool:1]  Lab Results:  Basename 01/22/12 0501 01/21/12 0611 01/20/12 0617  WBC 11.6* 14.1* 15.6*  HGB 7.0* 7.3* 7.6*  HCT 20.7* 21.3* 21.8*  PLT 203 213 226   BMET  Basename 01/22/12 0501 01/21/12 0611 01/20/12 0617  NA 125* 115* 112*  K 4.0 4.1 3.1*  CL 93* 86* 80*  CO2 23 21 20   GLUCOSE 111* 83 90  BUN 7 9 7   CREATININE 0.76 0.84 0.82  CALCIUM 8.2* 7.7* 7.6*   LFT  Basename 01/22/12 0501 01/21/12 0611 01/20/12 0617  PROT 7.0 7.1 7.3  ALBUMIN 1.6* 1.3* 1.4*  AST 94* 99* 96*  ALT 19 21 22   ALKPHOS 201* 234* 238*  BILITOT 5.9* 6.1* 5.7*  BILIDIR -- -- --  IBILI -- -- --    PT/INR  Basename 01/22/12 0501 01/21/12 0611  LABPROT 22.9* 21.7*  INR 1.99* 1.85*   Hepatitis Panel  Basename 01/19/12 1900  HEPBSAG NEGATIVE  HCVAB NEGATIVE  HEPAIGM NEGATIVE  HEPBIGM NEGATIVE    Assessment: 46 year old admitted with alcoholic hepatitis. Hep panel negative. Responded well to albumin and lasix yesterday. DF elevated at 58 with control of 11.6, 48 with control of 15.2. Na improved significantly since admission. Albumin slightly improved to 1.6. No wt today. Continued ascites noted. Needs prednisolone for 1 month with taper of 2-4 weeks following completion of one month. May benefit from paracentesis tomorrow, as sodium is much improved. Will give one more round of albumin and lasix, start glucocorticoids. No evidence of GI bleed with heme negative stools. Hgb slightly down today, but likely multifactorial/dilutional.   Plan: Albumin 25g followed by 40 mg Lasix Start Prednisolone 30 mg today  Consider LVAP if necessary Wt today and daily thereafter Accurate I/Os  LOS: 3 days   Eula Listen  01/22/2012, 5:25 PM

## 2012-01-22 NOTE — Progress Notes (Signed)
Subjective: No n/v, no signs of gi bleeding, tolerating diet, wants to quit drinking. Heme negative. No wt today. Wt 160 yesterday.  Objective: Vital signs in last 24 hours: Temp:  [98.1 F (36.7 C)-99.6 F (37.6 C)] 99.6 F (37.6 C) (02/25 0407) Pulse Rate:  [102-115] 102  (02/25 0407) Resp:  [18-20] 20  (02/25 0407) BP: (101-106)/(62-68) 101/65 mmHg (02/25 0407) SpO2:  [97 %-99 %] 98 % (02/25 0407) Weight:  [160 lb (72.576 kg)-162 lb 3.2 oz (73.573 kg)] 162 lb 3.2 oz (73.573 kg) (02/25 0627) Last BM Date: 01/21/12 General:   Alert and oriented, pleasant Head:  Normocephalic and atraumatic. Eyes:  Question mild scleral icterus Heart:  S1, S2 present, no murmurs noted.  Lungs: Clear to auscultation bilaterally, without wheezing, rales, or rhonchi.  Abdomen:  Bowel sounds present, distended, fluid wave noted, non-tense Msk:  Symmetrical without gross deformities. Normal posture. Extremities:  1-2+ edema pitting to knee Neurologic:  Alert and  oriented x4;  grossly normal neurologically. No asterixis noted Skin:  Warm and dry, intact without significant lesions.  Psych:  Alert and cooperative. Normal mood and affect.  Intake/Output from previous day: 02/24 0701 - 02/25 0700 In: 240 [P.O.:230; I.V.:10] Out: 1550 [Urine:1550] Intake/Output this shift:    Lab Results:  Basename 01/22/12 0501 01/21/12 0611 01/20/12 0617  WBC 11.6* 14.1* 15.6*  HGB 7.0* 7.3* 7.6*  HCT 20.7* 21.3* 21.8*  PLT 203 213 226   BMET  Basename 01/22/12 0501 01/21/12 0611 01/20/12 0617  NA 125* 115* 112*  K 4.0 4.1 3.1*  CL 93* 86* 80*  CO2 23 21 20   GLUCOSE 111* 83 90  BUN 7 9 7   CREATININE 0.76 0.84 0.82  CALCIUM 8.2* 7.7* 7.6*   LFT  Basename 01/22/12 0501 01/21/12 0611 01/20/12 0617  PROT 7.0 7.1 7.3  ALBUMIN 1.6* 1.3* 1.4*  AST 94* 99* 96*  ALT 19 21 22   ALKPHOS 201* 234* 238*  BILITOT 5.9* 6.1* 5.7*  BILIDIR -- -- --  IBILI -- -- --   PT/INR  Basename 01/22/12 0501 01/21/12  0611  LABPROT 22.9* 21.7*  INR 1.99* 1.85*   Hepatitis Panel  Basename 01/19/12 1900  HEPBSAG NEGATIVE  HCVAB NEGATIVE  HEPAIGM NEGATIVE  HEPBIGM NEGATIVE    Assessment: 46 year old admitted with alcoholic hepatitis. Hep panel negative. Responded well to albumin and lasix yesterday. DF elevated at 58 with control of 11.6, 48 with control of 15.2. Na improved significantly since admission. Albumin slightly improved to 1.6. No wt today. Continued ascites noted. Needs prednisolone for 1 month with taper of 2-4 weeks following completion of one month. May benefit from paracentesis tomorrow, as sodium is much improved. Will give one more round of albumin and lasix, start glucocorticoids. No evidence of GI bleed with heme negative stools. Hgb slightly down today, but likely multifactorial/dilutional.   Plan: Albumin 25g followed by 40 mg Lasix Start Prednisolone 30 mg today  Consider LVAP if necessary Wt today and daily thereafter Accurate I/Os  LOS: 3 days   Gerrit Halls  01/22/2012, 9:17 AM

## 2012-01-23 LAB — CBC
HCT: 22 % — ABNORMAL LOW (ref 36.0–46.0)
MCV: 98.7 fL (ref 78.0–100.0)
Platelets: 206 10*3/uL (ref 150–400)
RBC: 2.23 MIL/uL — ABNORMAL LOW (ref 3.87–5.11)
WBC: 13.3 10*3/uL — ABNORMAL HIGH (ref 4.0–10.5)

## 2012-01-23 LAB — BASIC METABOLIC PANEL
BUN: 5 mg/dL — ABNORMAL LOW (ref 6–23)
CO2: 25 mEq/L (ref 19–32)
Chloride: 96 mEq/L (ref 96–112)
Creatinine, Ser: 0.54 mg/dL (ref 0.50–1.10)
Potassium: 4 mEq/L (ref 3.5–5.1)

## 2012-01-23 LAB — PROTIME-INR: Prothrombin Time: 21.2 seconds — ABNORMAL HIGH (ref 11.6–15.2)

## 2012-01-23 LAB — HEPATIC FUNCTION PANEL
AST: 81 U/L — ABNORMAL HIGH (ref 0–37)
Albumin: 2 g/dL — ABNORMAL LOW (ref 3.5–5.2)
Total Bilirubin: 5.5 mg/dL — ABNORMAL HIGH (ref 0.3–1.2)
Total Protein: 7.7 g/dL (ref 6.0–8.3)

## 2012-01-23 MED ORDER — VITAMIN K1 10 MG/ML IJ SOLN
10.0000 mg | Freq: Every day | INTRAVENOUS | Status: AC
  Administered 2012-01-23 – 2012-01-24 (×2): 10 mg via INTRAVENOUS
  Filled 2012-01-23 (×3): qty 1

## 2012-01-23 MED ORDER — SPIRONOLACTONE 25 MG PO TABS
12.5000 mg | ORAL_TABLET | Freq: Every day | ORAL | Status: DC
  Administered 2012-01-23 – 2012-01-27 (×5): 12.5 mg via ORAL
  Filled 2012-01-23 (×5): qty 1

## 2012-01-23 MED ORDER — SODIUM CHLORIDE 0.9 % IJ SOLN
INTRAMUSCULAR | Status: AC
  Administered 2012-01-23: 10 mL
  Filled 2012-01-23: qty 3

## 2012-01-23 MED ORDER — SODIUM CHLORIDE 0.9 % IJ SOLN
INTRAMUSCULAR | Status: AC
  Administered 2012-01-23: 17:00:00
  Filled 2012-01-23: qty 3

## 2012-01-23 MED ORDER — FUROSEMIDE 40 MG PO TABS
40.0000 mg | ORAL_TABLET | Freq: Every day | ORAL | Status: DC
  Administered 2012-01-23 – 2012-01-27 (×5): 40 mg via ORAL
  Filled 2012-01-23 (×5): qty 1

## 2012-01-23 NOTE — Progress Notes (Signed)
Subjective: No abdominal pain. Feels lower extremity edema is worse. Abdomen less distended. No N/V, signs of GI bleeding.  Wt 2/23: 160 2/25: 162 Today: 158.   Objective: Vital signs in last 24 hours: Temp:  [98.1 F (36.7 C)-99.1 F (37.3 C)] 98.1 F (36.7 C) (02/26 0636) Pulse Rate:  [59-121] 59  (02/26 0636) Resp:  [18-20] 20  (02/26 0636) BP: (102-134)/(66-76) 102/66 mmHg (02/26 0636) SpO2:  [94 %-99 %] 96 % (02/26 0636) Weight:  [158 lb 6.4 oz (71.85 kg)] 158 lb 6.4 oz (71.85 kg) (02/26 0636) Last BM Date: 01/22/12 General:   Alert and oriented, pleasant Head:  Normocephalic and atraumatic. Eyes:  Mild icterus  Mouth:  Without lesions, mucosa pink and moist.  Heart:  S1, S2 present, no murmurs noted.  Lungs: Clear to auscultation bilaterally, without wheezing, rales, or rhonchi.  Abdomen:  Bowel sounds present, less protuberant than yesterday but still distended, soft, non-tender,  Msk:  Symmetrical without gross deformities. Normal posture. Pulses:  Normal pulses noted. Extremities:  1-2+ edema to knee Neurologic:  Alert and  oriented x4;  grossly normal neurologically. Skin:  Warm and dry, intact without significant lesions.  Cervical Nodes:  No significant cervical adenopathy. Psych:  Alert and cooperative. Normal mood and affect.  Intake/Output from previous day: 02/25 0701 - 02/26 0700 In: 484 [P.O.:180; IV Piggyback:304] Out: 701 [Urine:700; Stool:1] Intake/Output this shift: Total I/O In: 340 [P.O.:340] Out: 900 [Urine:900]  Lab Results:  Basename 01/23/12 0606 01/22/12 0501 01/21/12 0611  WBC 13.3* 11.6* 14.1*  HGB 7.2* 7.0* 7.3*  HCT 22.0* 20.7* 21.3*  PLT 206 203 213   BMET  Basename 01/23/12 0606 01/22/12 0501 01/21/12 0611  NA 129* 125* 115*  K 4.0 4.0 4.1  CL 96 93* 86*  CO2 25 23 21   GLUCOSE 105* 111* 83  BUN 5* 7 9  CREATININE 0.54 0.76 0.84  CALCIUM 8.5 8.2* 7.7*   LFT  Basename 01/23/12 1102 01/22/12 0501 01/21/12 0611  PROT 7.7  7.0 7.1  ALBUMIN 2.0* 1.6* 1.3*  AST 81* 94* 99*  ALT 20 19 21   ALKPHOS 202* 201* 234*  BILITOT 5.5* 5.9* 6.1*  BILIDIR 3.3* -- --  IBILI 2.2* -- --   PT/INR  Basename 01/23/12 1102 01/22/12 0501  LABPROT 21.2* 22.9*  INR 1.80* 1.99*    Assessment: 46 year old admitted with alcoholic hepatitis. Hep panel negative. Has completed 2 rounds of albumin/lasix over past 2 days with overall decent response, abdomen less distended today, wt down 4 lbs from yesterday, notes worsening lower extremity edema. Started Prednisolone yesterday at 30 mg due to elevated DF. PT/INR improving, LFTs improving.   Remains anemic, heme negative, Hgb holding in 7 range. Pt asymptomatic. Na much improved from admission, now 129, Albumin 2.0. Would benefit from low-dose diuretics, yet with hx of hyponatremia would need close/careful monitoring. Will d/w Dr. Darrick Penna prior to initiating today.   Plan: ~Prednisolone 30 mg X 1 month with taper of 2-4 weeks thereafter ~Anticipate possible low dose diuretic with close monitoring of electrolytes. Renal function good.  ~Hopeful d/c in near future   LOS: 4 days   Katrina Roberts  01/23/2012, 12:36 PM

## 2012-01-23 NOTE — Progress Notes (Signed)
NAMEALIECE, HONOLD NO.:  [id number]  MEDICAL RECORD NO.:  [id number]  LOCATION:  A327                          FACILITY:  [facility]  PHYSICIAN:  Mila Homer. [name], M.D.DATE OF BIRTH:  01/21/1966  DATE OF PROCEDURE: DATE OF DISCHARGE:                                PROGRESS NOTE   SUBJECTIVE:  She is feeling better today.  OBJECTIVE:  VITAL SIGNS:  Temperature is 98.1, pulse 69, respiratory rate 20, blood pressure 102/66. GENERAL:  She is sitting up in bed.  She is well-developed, well- nourished, oriented and alert. HEART:  Has a regular rhythm, rate of about 60. LUNGS:  Clear throughout. ABDOMEN:  She still has distention of her abdomen throughout the abdomen, but it is softer on palpation than it was yesterday. EXTREMITIES:  She has at least 2+ pitting edema of the distal legs and feet.  LABORATORY STUDIES:  White cell count today is 13,300 and the hemoglobin is stable at 7.2.  Glucoses have been in the 100 range.  Her sodium is up to 129 from 125 yesterday.  BUN 5.  ASSESSMENT: 1. Cirrhosis with massive ascites. 2. Chronic ethyl alcohol abuse. 3. Electrolyte abnormalities, continuing to improve. 4. Alcoholic hepatitis. 5. Anemia, multifactorial cause, some of which may be dilutional.  PLAN:  Add furosemide and spironolactone.  Continue to monitor the electrolytes.  Recheck a hemoglobin tomorrow.     Mila Homer. [name], M.D.     SDK/MEDQ  D:  01/23/2012  T:  01/23/2012  Job:  [id number]

## 2012-01-24 ENCOUNTER — Encounter (HOSPITAL_COMMUNITY): Payer: Self-pay | Admitting: *Deleted

## 2012-01-24 DIAGNOSIS — K7010 Alcoholic hepatitis without ascites: Secondary | ICD-10-CM

## 2012-01-24 DIAGNOSIS — K921 Melena: Secondary | ICD-10-CM

## 2012-01-24 DIAGNOSIS — D649 Anemia, unspecified: Secondary | ICD-10-CM

## 2012-01-24 LAB — CBC
HCT: 21.9 % — ABNORMAL LOW (ref 36.0–46.0)
Hemoglobin: 7.1 g/dL — ABNORMAL LOW (ref 12.0–15.0)
MCH: 32.1 pg (ref 26.0–34.0)
MCV: 99.1 fL (ref 78.0–100.0)
Platelets: 188 10*3/uL (ref 150–400)
RBC: 2.21 MIL/uL — ABNORMAL LOW (ref 3.87–5.11)

## 2012-01-24 LAB — BASIC METABOLIC PANEL
BUN: 5 mg/dL — ABNORMAL LOW (ref 6–23)
CO2: 25 mEq/L (ref 19–32)
Calcium: 8.4 mg/dL (ref 8.4–10.5)
Chloride: 99 mEq/L (ref 96–112)
Creatinine, Ser: 0.56 mg/dL (ref 0.50–1.10)
Glucose, Bld: 102 mg/dL — ABNORMAL HIGH (ref 70–99)

## 2012-01-24 MED ORDER — PANTOPRAZOLE SODIUM 40 MG PO TBEC
40.0000 mg | DELAYED_RELEASE_TABLET | Freq: Two times a day (BID) | ORAL | Status: DC
  Administered 2012-01-24 – 2012-01-27 (×7): 40 mg via ORAL
  Filled 2012-01-24 (×7): qty 1

## 2012-01-24 NOTE — Progress Notes (Signed)
Katrina Roberts, LANGE NO.:  [id number]  MEDICAL RECORD NO.:  [id number]  LOCATION:  A327                          FACILITY:  [facility]  PHYSICIAN:  Mila Homer. [name], M.D.DATE OF BIRTH:  1966-09-12  DATE OF PROCEDURE: DATE OF DISCHARGE:                                PROGRESS NOTE   She says she is feeling somewhat better.  OBJECTIVE:  Temperature is 98.2, pulse 101, respiratory rate 18, blood pressure 105/58.  She is sitting up in bed.  Color is good.  Energy level looks fairly good.  Her lungs are clear throughout.  Her heart has a fairly regular rhythm and rate of 80.  She has still massive abdominal distention, but it really is no longer tight.  I cannot palpate any abdominal organs through this ascitic fluid.  She does have 2+ pitting edema of the distal legs, ankles and feet.  Her white cell count today is 15,200 and a hemoglobin 7.1, platelet count 188,000.  Her serum sodium is 131, which is up from 129 and BUN is 5.  Her INR is 1.80 and sugars have been roughly 100.  ASSESSMENT: 1. Cirrhosis with massive ascites. 2. Chronic ethyl alcohol abuse. 3. Electrolyte abnormalities, with continued improvement. 4. Alcoholic hepatitis. 5. Anemia of multifactorial cause. 6. Elevated INR secondary to liver dysfunction.  PLAN:  She is due to get vitamin K for enough doses to get the INR down to a safe range before she has ascitic fluid removed.  Meanwhile continue her current medications, which include Furosemide 40 mg daily, and spironolactone 12.5 mg daily.  Will increase the spironolactone in the next 1-2 days.     Mila Homer. [name], M.D.     SDK/MEDQ  D:  01/24/2012  T:  01/24/2012  Job:  [id number]

## 2012-01-24 NOTE — Progress Notes (Signed)
Mother feels MS is back to baseline. Ability to diurese limited due to low Na. On low dose Lasix and Aldactone. Pt clinically improved. Pt wondering  If a LV  Paracentesis will be done,  INR > 1.5. Give Vit K 10 mg iv for 2 days. Consider LVP if INR remains >1.5 if the benefits outweigh the risks. Discussed with pt and mother.

## 2012-01-24 NOTE — Progress Notes (Signed)
Subjective:  Feels better. States abdomen less tense. BM yesterday. No melena, brbpr. No abd pain. Weight actually up two pounds.   Objective: Vital signs in last 24 hours: Temp:  [97.8 F (36.6 C)-98.2 F (36.8 C)] 98.2 F (36.8 C) (02/27 1610) Pulse Rate:  [101-103] 101  (02/27 0613) Resp:  [18-20] 18  (02/27 0613) BP: (105-118)/(58-76) 105/58 mmHg (02/27 0613) SpO2:  [97 %-99 %] 99 % (02/27 0613) Weight:  [160 lb 11.5 oz (72.9 kg)] 160 lb 11.5 oz (72.9 kg) (02/27 0606) Last BM Date: 01/23/12 General:   Alert,  Chronically ill-appearing WF in NAD Head:  Normocephalic and atraumatic. Eyes:  Sclera clear, no icterus.  Chest: CTA bilaterally without rales, rhonchi, crackles.    Heart:  Regular rate and rhythm; no murmurs, clicks, rubs,  or gallops. Abdomen:  Soft, distended but not tense. Normal bowel sounds, without guarding, and without rebound.   Extremities:  Without clubbing, deformity. 2+pitting edema. Neurologic:  Alert and  oriented x4;  grossly normal neurologically. Skin:  Intact without significant lesions or rashes. Psych:  Alert and cooperative. Normal mood and affect.  Intake/Output from previous day: 02/26 0701 - 02/27 0700 In: 1170 [P.O.:820; IV Piggyback:350] Out: 2700 [Urine:2700] Intake/Output this shift: Total I/O In: 240 [P.O.:240] Out: -   Lab Results: CBC  Basename 01/24/12 0608 01/23/12 0606 01/22/12 0501  WBC 15.2* 13.3* 11.6*  HGB 7.1* 7.2* 7.0*  HCT 21.9* 22.0* 20.7*  MCV 99.1 98.7 97.2  PLT 188 206 203   BMET  Basename 01/24/12 0608 01/23/12 0606 01/22/12 0501  NA 131* 129* 125*  K 3.5 4.0 4.0  CL 99 96 93*  CO2 25 25 23   GLUCOSE 102* 105* 111*  BUN 5* 5* 7  CREATININE 0.56 0.54 0.76  CALCIUM 8.4 8.5 8.2*   LFTs  Basename 01/23/12 1102 01/22/12 0501  BILITOT 5.5* 5.9*  BILIDIR 3.3* --  IBILI 2.2* --  ALKPHOS 202* 201*  AST 81* 94*  ALT 20 19  PROT 7.7 7.0  ALBUMIN 2.0* 1.6*   No results found for this basename: LIPASE:3  in the last 72 hours PT/INR  Basename 01/23/12 1102 01/22/12 0501  LABPROT 21.2* 22.9*  INR 1.80* 1.99*       Assessment: Etoh hepatitis with anasarca. Protein calorie malnutrition.  Abdomen less tense. Lower extremity edema about the same. Weight up two pounds. INR slightly improved. LFTs with slight trend downward. Hgb still low but stable. Heme negative and no over GI bleeding.   Hyponatremia improving.  Plan: 1.Patient on prednisone rather than prednisolone.  2.Monitor diuretic therapy closely given hyponatremia. 3.Consider LVAP prior to d/c if INR improved and/or benefits outweight risks.  4.EGD if recurrent melena or additional drop in Hgb. Consider prior to D/C? To discuss with Dr. Jena Gauss.    LOS: 5 days   Tana Coast  01/24/2012, 1:28 PM   As outlined above. None by history prior to admission. Hemoccult negative x1 here. Serum sodium significantly improved. Would offer the patient a large-volume paracentesis in the morning to expedite therapy. Also, would go ahead and offer EGD to further investigate her recent history of melena and her significant anemia.

## 2012-01-25 ENCOUNTER — Inpatient Hospital Stay (HOSPITAL_COMMUNITY): Payer: Managed Care, Other (non HMO)

## 2012-01-25 ENCOUNTER — Encounter (HOSPITAL_COMMUNITY)
Admission: AD | Disposition: A | Discharge status: Home / Self Care | Payer: Self-pay | Source: Ambulatory Visit | Attending: Family Medicine

## 2012-01-25 ENCOUNTER — Encounter (HOSPITAL_COMMUNITY): Payer: Self-pay | Admitting: *Deleted

## 2012-01-25 DIAGNOSIS — K210 Gastro-esophageal reflux disease with esophagitis: Secondary | ICD-10-CM

## 2012-01-25 DIAGNOSIS — K2960 Other gastritis without bleeding: Secondary | ICD-10-CM

## 2012-01-25 DIAGNOSIS — K921 Melena: Secondary | ICD-10-CM

## 2012-01-25 DIAGNOSIS — K319 Disease of stomach and duodenum, unspecified: Secondary | ICD-10-CM

## 2012-01-25 HISTORY — PX: ESOPHAGOGASTRODUODENOSCOPY: SHX5428

## 2012-01-25 LAB — BODY FLUID CELL COUNT WITH DIFFERENTIAL
Eos, Fluid: 0 %
Neutrophil Count, Fluid: 20 % (ref 0–25)

## 2012-01-25 LAB — BASIC METABOLIC PANEL
BUN: 6 mg/dL (ref 6–23)
CO2: 27 mEq/L (ref 19–32)
Calcium: 8.3 mg/dL — ABNORMAL LOW (ref 8.4–10.5)
Chloride: 99 mEq/L (ref 96–112)
Creatinine, Ser: 0.52 mg/dL (ref 0.50–1.10)
Glucose, Bld: 93 mg/dL (ref 70–99)

## 2012-01-25 SURGERY — EGD (ESOPHAGOGASTRODUODENOSCOPY)
Anesthesia: Moderate Sedation

## 2012-01-25 MED ORDER — MEPERIDINE HCL 100 MG/ML IJ SOLN
INTRAMUSCULAR | Status: AC
  Filled 2012-01-25: qty 2

## 2012-01-25 MED ORDER — MIDAZOLAM HCL 5 MG/5ML IJ SOLN
INTRAMUSCULAR | Status: DC | PRN
  Administered 2012-01-25 (×3): 1 mg via INTRAVENOUS

## 2012-01-25 MED ORDER — SODIUM CHLORIDE 0.9 % IJ SOLN
INTRAMUSCULAR | Status: AC
  Administered 2012-01-25: 3 mL
  Filled 2012-01-25: qty 3

## 2012-01-25 MED ORDER — STERILE WATER FOR IRRIGATION IR SOLN
Status: DC | PRN
  Administered 2012-01-25: 15:00:00

## 2012-01-25 MED ORDER — SODIUM CHLORIDE 0.9 % IJ SOLN
INTRAMUSCULAR | Status: AC
  Administered 2012-01-25: 10 mL
  Filled 2012-01-25: qty 10

## 2012-01-25 MED ORDER — BUTAMBEN-TETRACAINE-BENZOCAINE 2-2-14 % EX AERO
INHALATION_SPRAY | CUTANEOUS | Status: DC | PRN
  Administered 2012-01-25: 2 via TOPICAL

## 2012-01-25 MED ORDER — PROMETHAZINE HCL 25 MG/ML IJ SOLN
INTRAMUSCULAR | Status: AC
  Filled 2012-01-25: qty 1

## 2012-01-25 MED ORDER — MIDAZOLAM HCL 5 MG/5ML IJ SOLN
INTRAMUSCULAR | Status: AC
  Filled 2012-01-25: qty 10

## 2012-01-25 MED ORDER — SODIUM CHLORIDE 0.45 % IV SOLN
INTRAVENOUS | Status: DC
Start: 2012-01-25 — End: 2012-01-25
  Administered 2012-01-25: 15:00:00 via INTRAVENOUS

## 2012-01-25 MED ORDER — MEPERIDINE HCL 100 MG/ML IJ SOLN
INTRAMUSCULAR | Status: DC | PRN
  Administered 2012-01-25 (×2): 25 mg via INTRAVENOUS

## 2012-01-25 MED ORDER — PROMETHAZINE HCL 25 MG/ML IJ SOLN
12.5000 mg | Freq: Once | INTRAMUSCULAR | Status: AC
  Administered 2012-01-25: 12.5 mg via INTRAVENOUS

## 2012-01-25 NOTE — Procedures (Signed)
PreOperative Dx: Ascites Postoperative Dx: Ascites Procedure:   US guided paracentesis Radiologist:  Tyron Russell Anesthesia:  5 ml of 1% lidocaine Specimen:  3580 ml of clear yellow ascitic fluid EBL:   None Complications: None

## 2012-01-25 NOTE — Progress Notes (Signed)
Lidocaine 1%         5mL injected                              abdominal fluid removed

## 2012-01-25 NOTE — Progress Notes (Signed)
Katrina Roberts, Katrina Roberts NO.:  [id number]  MEDICAL RECORD NO.:  [id number]  LOCATION:  A327                          FACILITY:  [facility]  PHYSICIAN:  Mila Homer. [name], M.D.DATE OF BIRTH:  February 07, 1966  DATE OF PROCEDURE: DATE OF DISCHARGE:                                PROGRESS NOTE   SUBJECTIVE:  She is feeling somewhat better, but she is hungry this morning since she is n.p.o. awaiting her paracentesis.  She notes that she has not done any cigarette smoking now since she has been hospitalized.  Before this she was smoking about 4 cigarettes a day.  She tended to smoke with drinking.  OBJECTIVE:  VITAL SIGNS:  Temperature is 97.9, pulse 89, respiratory rate 18, blood pressure 109/70. GENERAL:  She really looks somewhat more relaxed and happy today.  Her speech is normal, sentence structure intact.  Her sensorium is normal. HEART:  Has a regular rhythm, rate of about 80. LUNGS:  Clear throughout.  She is moving air well. ABDOMEN:  She still has massive abdominal distention, but it is no longer tense. EXTREMITIES:  She has at least 2+ pitting edema of the distal legs.  Today's sodium was up to 134 and the rest of the BMP was normal.  The INR is pending.  Sugars have been around 90.  ASSESSMENT: 1. Cirrhosis with massive ascites. 2. Chronic ethyl alcohol abuse. 3. Electrolyte abnormalities, improving nicely. 4. Alcoholic hepatitis. 5. Anemia, multifactorial cause. 6. Tobacco use disorder. 7. Elevated INR secondary to liver dysfunction.  PLAN:  We will recheck an INR today prior to her paracentesis.  We will recheck a BMP and CBC in the morning.  Meanwhile continue IV fluids.     Mila Homer. [name], M.D.     SDK/MEDQ  D:  01/25/2012  T:  01/25/2012  Job:  [id number]

## 2012-01-25 NOTE — Op Note (Signed)
[facility] [address] [place], [place]  [address]  ENDOSCOPY PROCEDURE REPORT  PATIENT:  Katrina, Roberts  MR#:  [id number] BIRTHDATE:  Aug 06, 1966, 45 yrs. old  GENDER:  female  ENDOSCOPIST:  R. Roetta Sessions, MD Caleen Essex Referred by:  Gareth Morgan, M.D.  PROCEDURE DATE:  01/25/2012 PROCEDURE:  EGD with esophageal and gastric biopsy  INDICATIONS:   melena by history. Decreased hemoglobin. Hemoccult negative, however. Status post therapeutic paracentesis earlier today with marked improvement in abdominal distention the  INFORMED CONSENT:   The risks, benefits, limitations, alternatives and imponderables have been discussed.  The potential for biopsy, esophogeal dilation, etc. have also been reviewed.  Questions have been answered.  All parties agreeable.  Please see the history and physical in the medical record for more information.  MEDICATIONS:       Versed 3 mg IV and Demerol 50 mg IV in divided doses. Phenergan 12.5 mg IV to augment conscious sedation. She spray.  DESCRIPTION OF PROCEDURE:   The EG-[id number] ([id number]) endoscope was introduced through the mouth and advanced to the second portion of the duodenum without difficulty or limitations.  The mucosal surfaces were surveyed very carefully during advancement of the scope and upon withdrawal.  Retroflexion view of the proximal stomach and esophagogastric junction was performed.  <<PROCEDUREIMAGES>>  FINDINGS:  No esophageal varices. Distal esophageal erosions. 2 one and a half centimeter "times" of salmon color nothing coming up into the distal soft. Patulous EG junction. Stomach emptied. Mild changes consistent with portal gastropathy. Antral erosions. Small hiatal hernia. No ulcer or infiltrating process. A lower is patent. Normal first and second portion of the duodenum  THERAPEUTIC / DIAGNOSTIC MANEUVERS PERFORMED:   Biopsies of antrum taken for histology. Biopsy of the abnormal distal  esophagus taken.  COMPLICATIONS:   None  IMPRESSION:   Erosive reflux esophagitis. Query short segment Barrett's esophagus -  status post biopsy.  Hiatal hernia. Portal gastropathy. Antral erosions-status post biopsy  RECOMMENDATIONS:     Advanced to a 2 g sodium diet. Agree with low-dose Lasix and spironolactone along with close monitoring of her           weight, fluid and electrolyte status.  PPI therapy chronically  `    Follow up on pathology. Anticipate hospital discharge seen.  Alcohol cessation strongly recommended  ______________________________ R. Roetta Sessions, MD Caleen Essex  CC:  n. eSIGNED:   R. Roetta Sessions at 01/25/2012 03:38 PM  Nickolas Madrid, [phone]

## 2012-01-26 ENCOUNTER — Telehealth: Payer: Self-pay | Admitting: Gastroenterology

## 2012-01-26 LAB — CBC
Hemoglobin: 7.1 g/dL — ABNORMAL LOW (ref 12.0–15.0)
MCH: 32.4 pg (ref 26.0–34.0)
MCV: 99.5 fL (ref 78.0–100.0)
Platelets: 144 10*3/uL — ABNORMAL LOW (ref 150–400)
RBC: 2.19 MIL/uL — ABNORMAL LOW (ref 3.87–5.11)
WBC: 14.2 10*3/uL — ABNORMAL HIGH (ref 4.0–10.5)

## 2012-01-26 LAB — BASIC METABOLIC PANEL
BUN: 8 mg/dL (ref 6–23)
CO2: 30 mEq/L (ref 19–32)
Chloride: 98 mEq/L (ref 96–112)
Creatinine, Ser: 0.53 mg/dL (ref 0.50–1.10)
Glucose, Bld: 94 mg/dL (ref 70–99)
Potassium: 3.5 mEq/L (ref 3.5–5.1)

## 2012-01-26 LAB — HEPATIC FUNCTION PANEL
ALT: 19 U/L (ref 0–35)
AST: 62 U/L — ABNORMAL HIGH (ref 0–37)
Alkaline Phosphatase: 152 U/L — ABNORMAL HIGH (ref 39–117)
Bilirubin, Direct: 2 mg/dL — ABNORMAL HIGH (ref 0.0–0.3)
Indirect Bilirubin: 1.6 mg/dL — ABNORMAL HIGH (ref 0.3–0.9)
Total Protein: 6.6 g/dL (ref 6.0–8.3)

## 2012-01-26 LAB — ABO/RH: ABO/RH(D): A POS

## 2012-01-26 LAB — HEMOGLOBIN AND HEMATOCRIT, BLOOD
HCT: 26.8 % — ABNORMAL LOW (ref 36.0–46.0)
Hemoglobin: 9 g/dL — ABNORMAL LOW (ref 12.0–15.0)

## 2012-01-26 MED ORDER — ACETAMINOPHEN 325 MG PO TABS
650.0000 mg | ORAL_TABLET | Freq: Once | ORAL | Status: AC
  Administered 2012-01-26: 650 mg via ORAL
  Filled 2012-01-26: qty 2

## 2012-01-26 MED ORDER — SODIUM CHLORIDE 0.9 % IJ SOLN
INTRAMUSCULAR | Status: AC
  Administered 2012-01-26: 10 mL
  Filled 2012-01-26: qty 3

## 2012-01-26 MED ORDER — DIPHENHYDRAMINE HCL 25 MG PO CAPS
25.0000 mg | ORAL_CAPSULE | Freq: Once | ORAL | Status: AC
  Administered 2012-01-26: 25 mg via ORAL
  Filled 2012-01-26: qty 1

## 2012-01-26 NOTE — Telephone Encounter (Signed)
Patient needs f/u with me in 2-3 weeks. Should go home from hospital tomorrow.

## 2012-01-26 NOTE — Progress Notes (Signed)
Subjective:  Patient feeling better except feels little lightheaded with standing. Abdominal tap yesterday provided her with some relief. Appetite good. No bloody stools. Swelling in extremities about the same.  Objective: Vital signs in last 24 hours: Temp:  [97.6 F (36.4 C)-98 F (36.7 C)] 97.9 F (36.6 C) (03/01 0627) Pulse Rate:  [82-100] 91  (03/01 0627) Resp:  [15-23] 18  (03/01 0627) BP: (96-137)/(56-88) 96/56 mmHg (03/01 0627) SpO2:  [96 %-100 %] 100 % (03/01 0627) Weight:  [142 lb 3.2 oz (64.5 kg)] 142 lb 3.2 oz (64.5 kg) (03/01 0627) Last BM Date: 01/26/12 General:   Alert,  Chronically ill appearing. NAD Head:  Normocephalic and atraumatic. Eyes:  Scleral icterus, was present yesterday but did not document.  Chest: CTA bilaterally without rales, rhonchi, crackles.    Heart:  Regular rate and rhythm; no murmurs, clicks, rubs,  or gallops. Abdomen:  Soft, less distended, nontender and nondistended. Normal bowel sounds, without guarding, and without rebound.   Extremities:  Without clubbing, deformity. 2+ pitting edema to mid-calf bilaterally. Neurologic:  Alert and  oriented x4;  grossly normal neurologically. Skin:  Intact without significant lesions or rashes. Psych:  Alert and cooperative. Normal mood and affect.  Intake/Output from previous day:   Intake/Output this shift:    Lab Results: CBC  Basename 01/26/12 0519 01/24/12 0608  WBC 14.2* 15.2*  HGB 7.1* 7.1*  HCT 21.8* 21.9*  MCV 99.5 99.1  PLT 144* 188   BMET  Basename 01/26/12 0519 01/25/12 0557 01/24/12 0608  NA 133* 134* 131*  K 3.5 3.9 3.5  CL 98 99 99  CO2 30 27 25   GLUCOSE 94 93 102*  BUN 8 6 5*  CREATININE 0.53 0.52 0.56  CALCIUM 7.7* 8.3* 8.4   LFTs  Basename 01/26/12 0847 01/23/12 1102  BILITOT 3.6* 5.5*  BILIDIR 2.0* 3.3*  IBILI 1.6* 2.2*  ALKPHOS 152* 202*  AST 62* 81*  ALT 19 20  PROT 6.6 7.7  ALBUMIN 1.5* 2.0*     PT/INR  Basename 01/26/12 0519 01/25/12 0942 01/23/12  1102  LABPROT 20.2* 20.1* 21.2*  INR 1.69* 1.68* 1.80*  Results for EARNEST, THALMAN (MRN [id number]) as of 01/26/2012 08:35  Ref. Range 01/25/2012 11:46  Monocyte-Macrophage-Serous Fluid Latest Range: 50-90 % 34 (L)  Other Cells, Fluid No range found MANY  Fluid Type-FCT No range found ASCITIC  Color, Fluid Latest Range: YELLOW  YELLOW  WBC, Fluid Latest Range: 0-1000 cu mm 160  Lymphs, Fluid No range found 46  Eos, Fluid No range found 0  Appearance, Fluid Latest Range: CLEAR  CLEAR  Neutrophil Count, Fluid Latest Range: 0-25 % 20   GRAM Stain negative/culture prelim (no growth at one day)  Imaging Studies: US Guided Needle Placement  01/25/2012  *RADIOLOGY REPORT*  ULTRASOUND GUIDED PARACENTESIS:  Clinical Data:  Ascites  Technique: After explanation of procedure, benefits, and risks, written informed consent was obtained. Time-out protocol was followed. Collection of ascites in right lower quadrant localized by ultrasound. Skin prepped and draped in usual sterile fashion. Skin and soft tissues anesthestized with 5 ml of 1% lidocaine. 5-French Yueh catheter placed into peritoneal cavity. 3580 ml of clear yellow fluid aspirated by vacuum bottle suction. Procedure tolerated well by patient without immediate complication. Fluid sent the laboratory for requested analysis.  IMPRESSION: Ultrasound-guided paracentesis of 3580 ml of fluid from right lower quadrant site.  Original Report Authenticated By: Lollie Marrow, M.D.     Assessment:  1. EGD showed  reflux esophagitis, ?short segment Barrett's esophagus, antral erosions. No varices. Biopsies of esophagus and antrum pending. 2. ETOH hepatitis with anasarca. Protein calorie malnutrition. S/P LVAP of 3500cc yesterday. No suspicion of SBP on fluid analysis.  3. Hyponatremia-improved, stable. 4. Anemia, profound but stable.  Plan: 1. Add on LFTs. Done. LFTs better. 2. Give one unit of packed red blood cells today. I discussed with Dr. [name]  who agrees. 3. Four weeks of prednisone 30mg  followed by quick taper over two weeks (6 weeks total). Ideally, she would receive prednisolone as outpatient.  4. Stop cipro. 5. Continue PPI. 6. 2 gram sodium diet, discussed at length with patient.  7. ETOH cessation, discussed at length with patient.  8. Office visit with [place] in 2-3 weeks. We will make arrangement.    LOS: 7 days   Tana Coast  01/26/2012, 10:28 AM I agree with the above assessment and plan; I'd like to thank Dr. [name] for his kind referral.

## 2012-01-26 NOTE — Progress Notes (Signed)
UR Chart Review Completed  

## 2012-01-26 NOTE — Progress Notes (Signed)
NAMESHONDELL, FABEL NO.:  [id number]  MEDICAL RECORD NO.:  [id number]  LOCATION:  A327                          FACILITY:  [facility]  PHYSICIAN:  Mila Homer. [name], M.D.DATE OF BIRTH:  1966-04-05  DATE OF PROCEDURE: DATE OF DISCHARGE:                                PROGRESS NOTE   SUBJECTIVE:  She had her paracentesis yesterday and 3.5 L of fluid was removed.  She feels better today.  OBJECTIVE:  Temperature  is 97.9, pulse 91, respiratory rate 18, blood pressure 96/56, but was 137/88  earlier.  She is somewhat recumbent in bed, and in no acute distress.  Abdomen is markedly decreased in size. She still has abdominal swelling but much less so.  Her heart has a regular rhythm, rate of about 90 and her lungs are clear throughout. She is moving air well.  She still has icterus noted in the skin and the sclera.  She has at least 2+ pitting edema of the distal legs and ankles.  Serum sodium today is 133 and stable. White cell count 14,100, hemoglobin 7.1, INR 1.69.  Her ascitic fluid  showed 160 white cells, and Gram stain was negative.  Culture and sensitivity pending.  There was 3580 mL of clear yellow fluid removed yesterday.  ASSESSMENT: 1. Cirrhosis with massive ascites, improved after paracentesis. 2. Chronic ethyl alcohol abuse. 3. Electrolyte abnormalities, stable. 4. Alcoholic hepatitis improved. 5. Anemia of multifactorial cause. 6. Tobacco use disorder.  PLAN:  Continue current treatments and consider discharge in the next several days, if okay with GI.     Mila Homer. [name], M.D.    SDK/MEDQ  D:  01/26/2012  T:  01/26/2012  Job:  [id number]

## 2012-01-27 LAB — TYPE AND SCREEN
Antibody Screen: NEGATIVE
Unit division: 0

## 2012-01-27 LAB — PROTIME-INR: INR: 1.49 (ref 0.00–1.49)

## 2012-01-27 MED ORDER — SPIRONOLACTONE 25 MG PO TABS: 25.0000 mg | ORAL_TABLET | Freq: Every day | ORAL | Status: DC

## 2012-01-27 MED ORDER — PREDNISONE 10 MG PO TABS: 30.0000 mg | ORAL_TABLET | Freq: Every day | ORAL | Status: AC

## 2012-01-27 MED ORDER — PANTOPRAZOLE SODIUM 40 MG PO TBEC: 40.0000 mg | DELAYED_RELEASE_TABLET | Freq: Every day | ORAL | Status: DC

## 2012-01-27 MED ORDER — FUROSEMIDE 40 MG PO TABS: 40.0000 mg | ORAL_TABLET | Freq: Every day | ORAL | Status: DC

## 2012-01-27 NOTE — Discharge Summary (Signed)
NAMEDEAIRA, Katrina Roberts NO.:  [id number]  MEDICAL RECORD NO.:  [id number]  LOCATION:  A327                          FACILITY:  [facility]  PHYSICIAN:  Mila Homer. [name], M.D.DATE OF BIRTH:  [birth year]  DATE OF ADMISSION:  01/19/2012 DATE OF DISCHARGE:  03/02/2013LH                              DISCHARGE SUMMARY   This 46 year old was admitted to the hospital severely ill with alcoholic hepatitis, cirrhosis, and ascites.  She had a benign 9 day hospitalization extending from February 22 to January 27, 2012.  Vital signs remained stable, although blood pressure was low.  Her admission white cell count 17,500 with hemoglobin 8.2.  Her alk phos was 269, albumin 1.6,  AST 118, total bilirubin 6.6, total protein 8.4. She had a BUN of 17, creatinine 0.69, calcium level 8, potassium 2.8, sodium 111, chloride 76.  Her white cell differential showed 80% neutrophils. She was put on normal saline and gradually rehydrated.  Her sodium gradually increased to 133.  White cell count gradually dropped down to about 14,000.  She did receive a unit of blood the day before discharge and hemoglobin went up from 7.1 at that time to 9.0.  I also followed her INRs which tended to be about 1.8 or 1.9 but were 1.49 the day of discharge.  Her sugars ran in the upper 80s and 90s. Bilirubin eventually dropped to 3.6.  Albumin is still low at 1.5  at the time of discharge.  She was seen by Dr. Jena Gauss, gastroenterologist.  She did receive an EGD by Dr. Kendell Bane which showed no esophageal varices but distal esophageal erosions and possible Barrett's esophagus.  She also had some changes consistent with portal gastropathy.  She had a small hiatal hernia.  She also had a had a paracentesis done 2 days prior to discharge, 3500 mL of clear yellow fluid was removed at this time and she felt markedly better.  At the time of discharge, she is much improved.  She was discharged on the following  medications: 1. Furosemide 40 mg daily. 2. Spironolactone 25 mg daily. 3. Pantoprazole 40 mg daily. 4. Prednisone 10 mg 3 tablets daily.  She had been on all these     medications while in the hospital, and had done well on them. She was strongly encouraged not to drink again and seemed to be motivated to do so.  She was to eat a well-balanced diet and gradually increase her exercise and follow up with me in the office in about 10 days.  FINAL DISCHARGE DIAGNOSES: 1. Alcoholic hepatitis with cirrhosis and massive ascites. 2. Chronic ethanol alcohol abuse. 3. Electrolyte abnormalities, stable. 4. Anemia of multifactorial cause. 5. Tobacco use disorder. 6. Edema secondary to hypoalbuminemia. 7. Tobacco.  The patient would usually smoke while drinking and has     been off cigarettes for 2 weeks, and she is to continue off these     when she went home.     Mila Homer. [name], M.D.     SDK/MEDQ  D:  01/27/2012  T:  01/27/2012  Job:  [id number]  cc:   R. Roetta Sessions, MD FACP [facility] [address] [place] [address]

## 2012-01-27 NOTE — Progress Notes (Signed)
Writer discussed discharge medications and instructions to pt, verbalized understanding. Encouraged to call with any arising questions after discharge.  Discussed importance of alcohol cessation and pt verbalized understanding.  Reinforced importance of follow up appts and to make it on Monday morning for 1 wk with [name].  Pt wheel chaired out in stable condition and in no distress.  Pt took all belongings as well.

## 2012-01-29 ENCOUNTER — Encounter: Payer: Self-pay | Admitting: Internal Medicine

## 2012-01-29 LAB — BODY FLUID CULTURE
Culture: NO GROWTH
Gram Stain: NONE SEEN

## 2012-01-30 ENCOUNTER — Encounter (HOSPITAL_COMMUNITY): Payer: Self-pay | Admitting: Internal Medicine

## 2012-02-02 ENCOUNTER — Encounter: Payer: Self-pay | Admitting: Gastroenterology

## 2012-02-02 NOTE — Telephone Encounter (Signed)
appt made for 03/19- letter mailed

## 2012-02-13 ENCOUNTER — Ambulatory Visit: Payer: Managed Care, Other (non HMO) | Admitting: Gastroenterology

## 2012-02-14 LAB — CBC WITH DIFFERENTIAL/PLATELET
HCT: 29 %
Hemoglobin: 9.1 g/dL — AB (ref 12.0–16.0)
WBC: 9.8
platelet count: 298

## 2012-02-14 LAB — COMPREHENSIVE METABOLIC PANEL
Albumin: 2.6
INR: 1.1
Potassium: 3.9 mmol/L
Sodium: 136 mmol/L — AB (ref 137–147)

## 2012-02-15 ENCOUNTER — Ambulatory Visit (INDEPENDENT_AMBULATORY_CARE_PROVIDER_SITE_OTHER): Payer: Managed Care, Other (non HMO) | Admitting: Gastroenterology

## 2012-02-15 ENCOUNTER — Encounter: Payer: Self-pay | Admitting: Gastroenterology

## 2012-02-15 VITALS — BP 105/63 | HR 97 | Temp 97.5°F | Ht 64.0 in | Wt 137.8 lb

## 2012-02-15 DIAGNOSIS — R601 Generalized edema: Secondary | ICD-10-CM

## 2012-02-15 DIAGNOSIS — K219 Gastro-esophageal reflux disease without esophagitis: Secondary | ICD-10-CM

## 2012-02-15 DIAGNOSIS — K7010 Alcoholic hepatitis without ascites: Secondary | ICD-10-CM

## 2012-02-15 DIAGNOSIS — D649 Anemia, unspecified: Secondary | ICD-10-CM

## 2012-02-15 DIAGNOSIS — R609 Edema, unspecified: Secondary | ICD-10-CM

## 2012-02-15 DIAGNOSIS — D539 Nutritional anemia, unspecified: Secondary | ICD-10-CM

## 2012-02-15 NOTE — Assessment & Plan Note (Signed)
Anemia, Hemoccult negative x1. Received one unit of packed red blood cells during recent hospitalization. We'll continue to monitor for nail. Last colonoscopy in 2011. If anemia does not improve and/or there are signs of GI bleeding, will need to consider repeating colonoscopy. Blood work from Dr. [name].

## 2012-02-15 NOTE — Assessment & Plan Note (Addendum)
Symptoms well controlled on pantoprazole 40 mg daily.

## 2012-02-15 NOTE — Patient Instructions (Signed)
We will review recent lab work from Dr. [name] and make further recommendations. Plans for abdominal ultrasound with drainage of fluid (if needed) in near future.

## 2012-02-15 NOTE — Progress Notes (Signed)
Primary Care Physician: Milana Obey, MD, MD  Primary Gastroenterologist:  Roetta Sessions, MD   Chief Complaint  Patient presents with  . Follow-up    alcoholic hepatitis    HPI: Katrina Roberts is a 46 y.o. female here for hospital followup. Recently hospitalized with acute alcoholic hepatitis, anasarca, anemia requiring transfusion. EGD showed reflux esophagitis, antral erosions without H. Pylori. Procedure done for anemia and questionable melena. She required arch via paracentesis of 3500 cc of fluid prior to her discharge. Cell count, cytology, cultures were unremarkable. She received one unit of packed red blood cells during her hospital stay. She also had significant hyponatremia which had to be treated.  Since discharge she's had some soreness in the epigastrium and along the flanks more on the right side. She can fill the fluid moving around in her belly and this causes discomfort. She has more pressure up under the diaphragm, worse with meals. Lower extremity edema nearly resolved.  Four bowel movements daily, formed to loose. No melena, brbpr. No fever, cough, congestion. No heartburn. Reports that she is eating much better. She complains of persistent nosebleeds.  She had blood work twice since her discharge. Was put on potassium supplement.  Current Outpatient Prescriptions  Medication Sig Dispense Refill  . ALPRAZolam (XANAX) 1 MG tablet Take 1 mg by mouth at bedtime as needed.       . furosemide (LASIX) 40 MG tablet Take 40 mg by mouth daily. 1.5 tablets a day      . ibuprofen (ADVIL,MOTRIN) 200 MG tablet Take 200 mg by mouth daily.      . pantoprazole (PROTONIX) 40 MG tablet Take 1 tablet (40 mg total) by mouth daily.  30 tablet  5  . potassium chloride SA (K-DUR,KLOR-CON) 20 MEQ tablet Take 20 mEq by mouth 2 (two) times daily.       . predniSONE (DELTASONE) 10 MG tablet Take 10 mg by mouth daily.       Marland Kitchen spironolactone (ALDACTONE) 25 MG tablet Take 1 tablet (25 mg  total) by mouth daily.  30 tablet  5    Allergies as of 02/15/2012 - Review Complete 02/15/2012  Allergen Reaction Noted  . Penicillins Swelling     ROS:  General: Negative for anorexia, weight loss, fever, chills, fatigue, weakness. ENT: Negative for hoarseness, difficulty swallowing , nasal congestion. CV: Negative for chest pain, angina, palpitations, dyspnea on exertion, peripheral edema.  Respiratory: Negative for dyspnea at rest, dyspnea on exertion, cough, sputum, wheezing.  GI: See history of present illness. GU:  Negative for dysuria, hematuria, urinary incontinence, urinary frequency, nocturnal urination.  Endo: Negative for unusual weight change.    Physical Examination:   BP 105/63  Pulse 97  Temp(Src) 97.5 F (36.4 C) (Temporal)  Ht 5\' 4"  (1.626 m)  Wt 137 lb 12.8 oz (62.506 kg)  BMI 23.65 kg/m2  General: chronically ill-appearing, protuberant abdomen with thin limbs. No acute distress.  Eyes: No icterus. Mouth: Oropharyngeal mucosa moist and pink , no lesions erythema or exudate. Lungs: Clear to auscultation bilaterally.  Heart: Regular rate and rhythm, no murmurs rubs or gallops.  Abdomen: Bowel sounds are normal, abdomen is distended, soft below the umbilicus but more hard in the upper abdomen. Positive fluid wave. Nontender.   Extremities: trace lower extremity edema. No clubbing or deformities. Neuro: Alert and oriented x 4   Skin: Warm and dry, no jaundice.  Mild bruising upper extremities. Psych: Alert and cooperative, normal mood and affect.  Labs:  Lab Results  Component Value Date   WBC 14.2* 01/26/2012   HGB 9.0* 01/26/2012   HCT 26.8* 01/26/2012   MCV 99.5 01/26/2012   PLT 144* 01/26/2012   Lab Results  Component Value Date   CREATININE 0.53 01/26/2012   BUN 8 01/26/2012   NA 133* 01/26/2012   K 3.5 01/26/2012   CL 98 01/26/2012   CO2 30 01/26/2012   Lab Results  Component Value Date   ALT 19 01/26/2012   AST 62* 01/26/2012   ALKPHOS 152* 01/26/2012    BILITOT 3.6* 01/26/2012   Lab Results  Component Value Date   INR 1.49 01/27/2012   INR 1.69* 01/26/2012   INR 1.68* 01/25/2012   Lab Results  Component Value Date   IRON 43 07/21/2010   TIBC 127* 07/21/2010   FERRITIN 584* 07/21/2010   Lab Results  Component Value Date   VITAMINB12 937* 07/21/2010   Lab Results  Component Value Date   FOLATE  Value: 6.0 (NOTE)  Reference Ranges        Deficient:       0.4 - 3.3 ng/mL        Indeterminate:   3.4 - 5.4 ng/mL        Normal:              > 5.4 ng/mL 07/21/2010   Lab Results  Component Value Date   HAV POS* 06/16/2010   HEPAIGM NEGATIVE 01/19/2012   HEPBIGM NEGATIVE 01/19/2012    Imaging Studies: Dg Chest 2 View  01/19/2012  *RADIOLOGY REPORT*  Clinical Data: 46 year old female with cough, congestion, weakness.  CHEST - 2 VIEW  Comparison: 06/16/2010 and earlier.  Findings: Small moderate right pleural effusion is new.  Associated right lung base atelectasis.  No pneumothorax, pulmonary edema or definite consolidation.  Cardiac size and mediastinal contours are within normal limits.  Visualized tracheal air column is within normal limits.  No acute osseous abnormality identified.  IMPRESSION: Small to moderate right pleural effusion with atelectasis is new. It is difficult to exclude superimposed right lung base infection.  Original Report Authenticated By: Harley Hallmark, M.D.   US Guided Needle Placement  01/25/2012  *RADIOLOGY REPORT*  ULTRASOUND GUIDED PARACENTESIS:  Clinical Data:  Ascites  Technique: After explanation of procedure, benefits, and risks, written informed consent was obtained. Time-out protocol was followed. Collection of ascites in right lower quadrant localized by ultrasound. Skin prepped and draped in usual sterile fashion. Skin and soft tissues anesthestized with 5 ml of 1% lidocaine. 5-French Yueh catheter placed into peritoneal cavity. 3580 ml of clear yellow fluid aspirated by vacuum bottle suction. Procedure tolerated well  by patient without immediate complication. Fluid sent the laboratory for requested analysis.  IMPRESSION: Ultrasound-guided paracentesis of 3580 ml of fluid from right lower quadrant site.  Original Report Authenticated By: Lollie Marrow, M.D.   US Paracentesis  01/25/2012  *RADIOLOGY REPORT*  ULTRASOUND GUIDED PARACENTESIS:  Clinical Data:  Ascites  Technique: After explanation of procedure, benefits, and risks, written informed consent was obtained. Time-out protocol was followed. Collection of ascites in right lower quadrant localized by ultrasound. Skin prepped and draped in usual sterile fashion. Skin and soft tissues anesthestized with 5 ml of 1% lidocaine. 5-French Yueh catheter placed into peritoneal cavity. 3580 ml of clear yellow fluid aspirated by vacuum bottle suction. Procedure tolerated well by patient without immediate complication. Fluid sent the laboratory for requested analysis.  IMPRESSION: Ultrasound-guided paracentesis of 3580 ml of fluid from right  lower quadrant site.  Original Report Authenticated By: Lollie Marrow, M.D.

## 2012-02-15 NOTE — Progress Notes (Signed)
Cc to PCP 

## 2012-02-15 NOTE — Assessment & Plan Note (Signed)
Recent acute alcoholic hepatitis. Now a long prednisone 10 mg daily. She has had blood work done at her PCP that we will need to obtain a copy. Looking back through her records she also has a history of mildly positive anti-smooth muscle antibody and elevated immunoglobulins. Previously there was concern about possibility of underlying autoimmune hepatitis as well but the patient was lost to followup couple years ago. She has not had any imaging of her liver in nearly 2 years. After a review her blood work, we will arrange for abdominal ultrasound plus or minus If needed any further blood work necessary. If she has evidence of hepatic cirrhosis, she will need to have a baseline alpha-fetoprotein tumor marker. I would also recommend she have hepatitis B vaccinations if not done previously.  Continue avoidance of alcohol.

## 2012-02-19 ENCOUNTER — Telehealth: Payer: Self-pay | Admitting: Gastroenterology

## 2012-02-19 NOTE — Telephone Encounter (Signed)
Open in error

## 2012-02-19 NOTE — Progress Notes (Signed)
Received lab work for Dr. Nash Mantis office. Labs on 02/14/2012.  Glucose 25, creatinine 0.48, sodium 136, potassium 3.9, albumin 2.6, total bilirubin 2.4, alkaline phosphatase 122, AST 32, ALT 18, albumin 2.6, INR 1.1, PT 11.9, white blood cell count 9800, hemoglobin 9.1, hematocrit 29.1, MCV 97, platelets 298,000.  Please let pt know: LFTs are getting better. Continue to avoid alcohol.   She is still anemic. We will continue to monitor.   Further labs needed NOW: Quantitative immunoglobulins Anti-smooth muscle antibody Ana ama afp  LFTs in 2 weeks.  Abd u/s now for h/o liver disease, abnormal LFTs. If she feels like she needs abd tap, let's arrange for that at the same time. Send fluid for cell count with diff and routine culture.  Let me know if any questions.

## 2012-02-22 ENCOUNTER — Other Ambulatory Visit: Payer: Self-pay | Admitting: Gastroenterology

## 2012-02-22 ENCOUNTER — Telehealth: Payer: Self-pay | Admitting: Gastroenterology

## 2012-02-22 DIAGNOSIS — R772 Abnormality of alphafetoprotein: Secondary | ICD-10-CM

## 2012-02-22 DIAGNOSIS — R609 Edema, unspecified: Secondary | ICD-10-CM

## 2012-02-22 DIAGNOSIS — D649 Anemia, unspecified: Secondary | ICD-10-CM

## 2012-02-22 DIAGNOSIS — K7010 Alcoholic hepatitis without ascites: Secondary | ICD-10-CM

## 2012-02-22 NOTE — Progress Notes (Signed)
Pt aware, lab order faxed to lab. lft's to be mailed to pt.  Katrina Roberts, pt feels like she needs tap while they are doing her u/s. Please schedule. She prefers an afternoon appt. thanks

## 2012-02-22 NOTE — Telephone Encounter (Signed)
Opened in era  

## 2012-02-22 NOTE — Progress Notes (Signed)
Quick Note:  Lab order done and faxed to lab. ______

## 2012-02-22 NOTE — Progress Notes (Signed)
[place] and TAP scheduled for 04/04 @ 12- pt aware and OK with this- instructed to be NPO 6hrs prior to procedures

## 2012-02-22 NOTE — Progress Notes (Signed)
Quick Note:  Patient has other labs to be done in near future. Can you please add on Hep b surface Antibody. ______

## 2012-02-23 LAB — HEPATITIS B SURFACE ANTIBODY,QUALITATIVE: Hep B S Ab: POSITIVE — AB

## 2012-02-23 LAB — IGG, IGA, IGM: IgM, Serum: 203 mg/dL (ref 52–322)

## 2012-02-23 LAB — AFP TUMOR MARKER: AFP-Tumor Marker: 12.5 ng/mL — ABNORMAL HIGH (ref 0.0–8.0)

## 2012-02-26 DIAGNOSIS — K7460 Unspecified cirrhosis of liver: Secondary | ICD-10-CM

## 2012-02-26 DIAGNOSIS — R772 Abnormality of alphafetoprotein: Secondary | ICD-10-CM

## 2012-02-26 HISTORY — DX: Unspecified cirrhosis of liver: K74.60

## 2012-02-26 HISTORY — DX: Abnormality of alphafetoprotein: R77.2

## 2012-02-26 LAB — MITOCHONDRIAL ANTIBODIES: Mitochondrial M2 Ab, IgG: 0.82 (ref <0.91)

## 2012-02-29 ENCOUNTER — Ambulatory Visit (HOSPITAL_COMMUNITY)
Admission: RE | Admit: 2012-02-29 | Discharge: 2012-02-29 | Disposition: A | Discharge status: Home / Self Care | Payer: Managed Care, Other (non HMO) | Source: Ambulatory Visit | Attending: Gastroenterology | Admitting: Gastroenterology

## 2012-02-29 DIAGNOSIS — K7460 Unspecified cirrhosis of liver: Secondary | ICD-10-CM | POA: Insufficient documentation

## 2012-02-29 DIAGNOSIS — R188 Other ascites: Secondary | ICD-10-CM | POA: Insufficient documentation

## 2012-02-29 LAB — BODY FLUID CELL COUNT WITH DIFFERENTIAL
Monocyte-Macrophage-Serous Fluid: 56 % (ref 50–90)
Total Nucleated Cell Count, Fluid: 182 cu mm (ref 0–1000)

## 2012-02-29 NOTE — Progress Notes (Signed)
Lidocaine 1%           5mL injected                             abdominal fluid removed

## 2012-02-29 NOTE — Procedures (Signed)
PreOperative Dx: Recurent ascites Postoperative Dx: Recurrent ascites Procedure:   US guided paracentesis Radiologist:  Tyron Russell Anesthesia:  5 ml of 1% lidocaine Specimen:  3480 ml of yellow fluid EBL:   < 1 ml Complications: None

## 2012-03-04 LAB — BODY FLUID CULTURE
Culture: NO GROWTH
Special Requests: NORMAL

## 2012-03-11 ENCOUNTER — Other Ambulatory Visit: Payer: Self-pay | Admitting: Gastroenterology

## 2012-03-11 DIAGNOSIS — K7010 Alcoholic hepatitis without ascites: Secondary | ICD-10-CM

## 2012-03-11 NOTE — Progress Notes (Signed)
Addended by: Jennings Books on: 03/11/2012 11:06 AM   Modules accepted: Orders

## 2012-03-11 NOTE — Progress Notes (Signed)
Quick Note:  No evidence of infection in abdominal fluid (ascitic fluid). ______

## 2012-03-11 NOTE — Progress Notes (Signed)
Quick Note:  Several issues need to be addressed. Abd u/s showed multiple gallstones, ?scarring of liver? AFP up, no prior baseline to compare. She is immune to Hep B. Smooth muscle ab weakly positive and elevated IgG/IgA again.  She needs repeat LFTs, Total Hep A ab. MRI liver with Eovist to rule out liver tumor. Elevated AFP. Office visit to follow results. Thanks. ______

## 2012-03-11 NOTE — Progress Notes (Signed)
Pt is scheduled for her MRI 03/12/2012@;00am, Pt was instructed to be NPO after MN and to arrive at [facility]@8 :30am  Per Myriam Jacobson pt doesn't require a pre certification.

## 2012-03-12 ENCOUNTER — Ambulatory Visit (HOSPITAL_COMMUNITY)
Admission: RE | Admit: 2012-03-12 | Discharge: 2012-03-12 | Disposition: A | Discharge status: Home / Self Care | Payer: Managed Care, Other (non HMO) | Source: Ambulatory Visit | Attending: Gastroenterology | Admitting: Gastroenterology

## 2012-03-12 ENCOUNTER — Other Ambulatory Visit: Payer: Self-pay | Admitting: Gastroenterology

## 2012-03-12 DIAGNOSIS — R110 Nausea: Secondary | ICD-10-CM | POA: Insufficient documentation

## 2012-03-12 DIAGNOSIS — K8020 Calculus of gallbladder without cholecystitis without obstruction: Secondary | ICD-10-CM | POA: Insufficient documentation

## 2012-03-12 DIAGNOSIS — R772 Abnormality of alphafetoprotein: Secondary | ICD-10-CM

## 2012-03-12 DIAGNOSIS — K7460 Unspecified cirrhosis of liver: Secondary | ICD-10-CM | POA: Insufficient documentation

## 2012-03-12 DIAGNOSIS — R109 Unspecified abdominal pain: Secondary | ICD-10-CM | POA: Insufficient documentation

## 2012-03-12 DIAGNOSIS — R188 Other ascites: Secondary | ICD-10-CM | POA: Insufficient documentation

## 2012-03-12 LAB — HEPATIC FUNCTION PANEL
ALT: 9 U/L (ref 0–35)
AST: 31 U/L (ref 0–37)
Albumin: 2.6 g/dL — ABNORMAL LOW (ref 3.5–5.2)
Total Bilirubin: 1 mg/dL (ref 0.3–1.2)

## 2012-03-12 LAB — HEPATITIS A ANTIBODY, TOTAL: Hep A Total Ab: NEGATIVE

## 2012-03-12 MED ORDER — GADOXETATE DISODIUM 0.25 MMOL/ML IV SOLN
6.0000 mL | Freq: Once | INTRAVENOUS | Status: AC | PRN
  Administered 2012-03-12: 6 mL via INTRAVENOUS

## 2012-03-18 ENCOUNTER — Other Ambulatory Visit: Payer: Self-pay | Admitting: Gastroenterology

## 2012-03-18 ENCOUNTER — Ambulatory Visit (INDEPENDENT_AMBULATORY_CARE_PROVIDER_SITE_OTHER): Payer: Managed Care, Other (non HMO) | Admitting: Gastroenterology

## 2012-03-18 ENCOUNTER — Encounter: Payer: Self-pay | Admitting: Gastroenterology

## 2012-03-18 VITALS — BP 100/59 | HR 93 | Temp 97.8°F | Ht 64.0 in | Wt 123.4 lb

## 2012-03-18 DIAGNOSIS — F1020 Alcohol dependence, uncomplicated: Secondary | ICD-10-CM

## 2012-03-18 DIAGNOSIS — K7460 Unspecified cirrhosis of liver: Secondary | ICD-10-CM

## 2012-03-18 DIAGNOSIS — K8020 Calculus of gallbladder without cholecystitis without obstruction: Secondary | ICD-10-CM

## 2012-03-18 DIAGNOSIS — D649 Anemia, unspecified: Secondary | ICD-10-CM

## 2012-03-18 DIAGNOSIS — K7010 Alcoholic hepatitis without ascites: Secondary | ICD-10-CM

## 2012-03-18 NOTE — Progress Notes (Signed)
Primary Care Physician: Milana Obey, MD, MD  Primary Gastroenterologist:  Roetta Sessions, MD   Chief Complaint  Patient presents with  . Follow-up    liver disease    HPI: Katrina Roberts is a 46 y.o. female here for one month f/u of etoh hepatitis. She continues to claim etoh abstinence. She states she is feeling a lot better. Denies anasarca. Last LVAP on 02/22/2011 with removal of 3400cc of ascitic fluid. Now on lasix 20mg  daily and aldactone 25mg  daily. Consuming no more than 2 grams of sodium daily. Drinks one gallon of tea daily. Denies abdominal pain, vomiting, constipation, diarrhea, melena, brbpr.   Current Outpatient Prescriptions  Medication Sig Dispense Refill  . ALPRAZolam (XANAX) 1 MG tablet Take 1 mg by mouth at bedtime as needed.       Marland Kitchen FLUoxetine (PROZAC) 20 MG tablet Take 30 mg by mouth daily.      . furosemide (LASIX) 40 MG tablet Take 20 mg by mouth daily.       Marland Kitchen ibuprofen (ADVIL,MOTRIN) 200 MG tablet Take 200 mg by mouth daily.      . pantoprazole (PROTONIX) 40 MG tablet Take 1 tablet (40 mg total) by mouth daily.  30 tablet  5  . potassium chloride SA (K-DUR,KLOR-CON) 20 MEQ tablet Take 20 mEq by mouth 2 (two) times daily.       Marland Kitchen spironolactone (ALDACTONE) 25 MG tablet Take 1 tablet (25 mg total) by mouth daily.  30 tablet  5    Allergies as of 03/18/2012 - Review Complete 03/18/2012  Allergen Reaction Noted  . Penicillins Swelling     ROS:  General: Negative for anorexia, weight loss, fever, chills, fatigue, weakness. ENT: Negative for hoarseness, difficulty swallowing , nasal congestion. CV: Negative for chest pain, angina, palpitations, dyspnea on exertion, peripheral edema.  Respiratory: Negative for dyspnea at rest, dyspnea on exertion, cough, sputum, wheezing.  GI: See history of present illness. GU:  Negative for dysuria, hematuria, urinary incontinence, urinary frequency, nocturnal urination.  Endo: Negative for unusual weight change. Weight  down from 137 01/2012 post removal of 3400cc of ascitic fluid.   Physical Examination:   BP 100/59  Pulse 93  Temp(Src) 97.8 F (36.6 C) (Temporal)  Ht 5\' 4"  (1.626 m)  Wt 123 lb 6.4 oz (55.974 kg)  BMI 21.18 kg/m2  General: Thin, WF in no acute distress.  Eyes: No icterus. Mouth: Oropharyngeal mucosa moist and pink , no lesions erythema or exudate. Lungs: Clear to auscultation bilaterally.  Heart: Regular rate and rhythm, no murmurs rubs or gallops.  Abdomen: Bowel sounds are normal, nontender, no distention, no obvious ascites. nondistended, no splenomegaly or masses, no abdominal bruits or hernia , no rebound or guarding.  Liver edge easily palpable. Extremities: No lower extremity edema. No clubbing or deformities. Neuro: Alert and oriented x 4   Skin: Warm and dry, no jaundice.   Psych: Alert and cooperative, normal mood and affect.  Labs:  Lab Results  Component Value Date   ALT 9 03/11/2012   AST 31 03/11/2012   ALKPHOS 90 03/11/2012   BILITOT 1.0 03/11/2012   Lab Results  Component Value Date   ANA NEG 02/22/2012   Lab Results  Component Value Date   SMOOTHMUSCAB 20* 02/22/2012  IgG 1800, IgA 1310, IgM 203. Improved from one 2012. Hep B surf Ab positive. AFP 12.5H Hep A total, negative (reported as positive 05/2010)  Imaging Studies: US Abdomen Complete  02/29/2012  *RADIOLOGY REPORT*  Clinical  Data:  Increased LFTs  ABDOMINAL ULTRASOUND COMPLETE  Comparison:  None.  Findings:  Gallbladder: Multiple mobile gallstones are present. The largest measures approximately 1.3 cm.  No gallbladder wall thickening or pericholecystic fluid. Negative sonographic Murphy's sign.  Common Bile Duct:  Within normal limits in caliber. Measures 4 mm.  Liver: No focal mass lesion identified. Microlobular contour suggesting  cirrhosis.  There is moderate ascites. There is hepatopetal flow within the portal vein; however, there is a recanalized paraumbilical vein  IVC:  Appears normal.   Pancreas:  No abnormality identified.  Spleen: Upper limits of normal in size, measuring 11.9 cm in length and 458 ml in volume.  Within normal limits in echotexture.  Right kidney:  Normal in size and parenchymal echogenicity.  No evidence of mass or hydronephrosis.  Left kidney:  Normal in size and parenchymal echogenicity.  No evidence of mass or hydronephrosis.  Abdominal Aorta:  No aneurysm identified.  IMPRESSION: Cirrhosis with micro nodular liver contour and ascites. The portal vein is patent and flows in the hepatopetal direction but there is a recanalized paraumbilical vein.  The spleen is upper limits of normal in size.  Cholelithiasis without sonographic evidence of cholecystitis.  Original Report Authenticated By: Brandon Melnick, M.D.   Mr 3d Recon At Scanner  03/12/2012  *RADIOLOGY REPORT*  Clinical Data:  Abdominal pain.  Nausea. Cirrhosis.  Elevated AFP level.  MRI ABDOMEN WITHOUT AND WITH CONTRAST (MRCP)  Technique:  Multiplanar multisequence MR imaging of the abdomen was performed without and with contrast, including heavily T2-weighted images of the biliary and pancreatic ducts.  Three-dimensional MR images were rendered by post processing of the original MR data.  Contrast:  6 ml Eovist  Comparison:  CT on 05/27/2010  Findings:  Hepatic cirrhosis is demonstrated with moderate ascites. Portal veins are patent.  Recanalization of the periumbilical veins is seen, consistent with portal venous hypertension.  No evidence of splenomegaly. No hypervascular or hypovascular liver masses are identified on dynamic imaging.  Gallbladder is incompletely distended and shows mild wall thickening likely secondary to cirrhosis.  Cholelithiasis is noted, however there is no evidence of biliary ductal dilatation. The common bile duct measures 6 mm in maximal diameter and there is no evidence of choledocholithiasis.  There is no evidence of pancreatic ductal dilatation or pancreas divisum.  The spleen, pancreas,  adrenal glands, and kidneys are normal in appearance.  Shotty lymph nodes are seen in the region of the celiac axis and porta hepatis, however no pathologically enlarged nodes are identified.  No focal inflammatory process or abscess identified.  IMPRESSION:  1.  Hepatic cirrhosis.  No evidence of hepatocellular carcinoma. 2.  Recanalization of periumbilical veins, consistent with hepatic cirrhosis.  Moderate ascites. 3.  Cholelithiasis.  No evidence of choledocholithiasis, or biliary or pancreatic ductal dilatation.  Original Report Authenticated By: Danae Orleans, M.D.   US Paracentesis  02/29/2012  *RADIOLOGY REPORT*  ULTRASOUND GUIDED PARACENTESIS:  Clinical Data:  Liver disease, recurrent ascites  Technique: After explanation of procedure, benefits, and risks, written informed consent was obtained. Time-out protocol was followed. Collection of ascites in right lower quadrant localized by ultrasound. Skin prepped and draped in usual sterile fashion. Skin and soft tissues anesthestized with 5 ml of 1% lidocaine. 5-French Yueh catheter placed into peritoneal cavity. 3480 ml of yellow colored fluid aspirated by vacuum bottle suction. Procedure tolerated well by patient without immediate complication.  IMPRESSION: Ultrasound-guided paracentesis of 3480 ml of fluid from right lower quadrant site.  Original Report Authenticated By: Lollie Marrow, M.D.

## 2012-03-18 NOTE — Assessment & Plan Note (Signed)
Patient aware. If develops need for gallbladder surgery, I instructed her to have liver biopsy at same time.

## 2012-03-18 NOTE — Assessment & Plan Note (Signed)
Resolved. Off prednisone.

## 2012-03-18 NOTE — Assessment & Plan Note (Signed)
Recheck CBC with anemia labs. Ifobt.

## 2012-03-18 NOTE — Assessment & Plan Note (Addendum)
New diagnosis of cirrhosis based on MRI findings. Previously with issues of etoh hepatitis but no previous indication of cirrhosis. Previously she did have weakly positive AMA, elevated immunoglobulins in 2011. Current IgG, IgA are elevated but less, still with weakly positive AMA. ?importance in setting of chronic/intermittent etoh abuse. To discuss with Dr. Jena Gauss, consider liver biopsy.   Last LFTs improved. Transaminases normal, albumin better. Last INR normal. Indicates return of hepatic function. She had elevated AFP but no tumor on MRI. Will recheck in 2 months. She also had negative Total Hep A with recent labs. I did not see where we had checked it previously, but now I see positive result from 2011. ?difficult to explain difference. To discuss with Dr. Jena Gauss, consider Hep A vaccination. Long discussion with patient today, must adhere to etoh cessation. If no etoh for six months, then refer to liver transplant center. Offered to set her up with psychologist to discuss addiction vs recommended she go to AA. Patient wants to discuss with Dr. [name] at her OV in next two weeks.   At her request, I spoke to her brother, Levell July via phone. Answered numerous questions. Length of discussion, 25 minutes.    OV in two months with Dr. Jena Gauss. Labs prior, including LFTs, INR, CBC, AFP.

## 2012-03-18 NOTE — Patient Instructions (Signed)
Please have your blood work done today. Collect stool to check for blood. Office visit in two months with labs. Continue the good work regarding NO ALCOHOL!!!!

## 2012-03-19 LAB — IRON AND TIBC
%SAT: 5 % — ABNORMAL LOW (ref 20–55)
TIBC: 305 ug/dL (ref 250–470)

## 2012-03-19 LAB — CBC WITH DIFFERENTIAL/PLATELET
Basophils Absolute: 0 10*3/uL (ref 0.0–0.1)
Eosinophils Absolute: 0.1 10*3/uL (ref 0.0–0.7)
Eosinophils Relative: 2 % (ref 0–5)
HCT: 28.7 % — ABNORMAL LOW (ref 36.0–46.0)
Lymphocytes Relative: 30 % (ref 12–46)
Lymphs Abs: 1.9 10*3/uL (ref 0.7–4.0)
MCH: 26.9 pg (ref 26.0–34.0)
MCV: 82.9 fL (ref 78.0–100.0)
Monocytes Absolute: 0.7 10*3/uL (ref 0.1–1.0)
Platelets: 275 10*3/uL (ref 150–400)
RDW: 17.1 % — ABNORMAL HIGH (ref 11.5–15.5)
WBC: 6.3 10*3/uL (ref 4.0–10.5)

## 2012-03-19 LAB — FOLATE: Folate: 7.4 ng/mL

## 2012-03-19 NOTE — Progress Notes (Signed)
Quick Note:  Add on ferritin. Await ifobt. ______

## 2012-03-19 NOTE — Progress Notes (Signed)
Faxed to PCP

## 2012-03-20 LAB — FERRITIN: Ferritin: 20 ng/mL (ref 10–291)

## 2012-03-22 NOTE — Progress Notes (Signed)
Quick Note:  Hgb stable but no improvement. Anemia panel most c/w IDA. Await ifobt. ______

## 2012-03-27 ENCOUNTER — Ambulatory Visit (INDEPENDENT_AMBULATORY_CARE_PROVIDER_SITE_OTHER): Payer: Managed Care, Other (non HMO) | Admitting: Gastroenterology

## 2012-03-27 DIAGNOSIS — D649 Anemia, unspecified: Secondary | ICD-10-CM

## 2012-03-27 LAB — IFOBT (OCCULT BLOOD): IFOBT: NEGATIVE

## 2012-03-27 NOTE — Progress Notes (Signed)
Quick Note:  Pt aware, she has not returned ifobt yet. She said she will asap. ______

## 2012-03-28 ENCOUNTER — Other Ambulatory Visit: Payer: Self-pay

## 2012-03-28 DIAGNOSIS — D649 Anemia, unspecified: Secondary | ICD-10-CM

## 2012-03-28 DIAGNOSIS — F1020 Alcohol dependence, uncomplicated: Secondary | ICD-10-CM

## 2012-03-28 DIAGNOSIS — K7460 Unspecified cirrhosis of liver: Secondary | ICD-10-CM

## 2012-03-29 NOTE — Progress Notes (Deleted)
Forward to [facility]SL 

## 2012-04-01 NOTE — Progress Notes (Signed)
Quick Note:  ifobt negative. IDA+/-anemia of chronic disease.  Let's start her on ferrous sulfate 325mg  BID. Recheck CBC and ferritin in 8 weeks.  Please arrange for her to get Hep A vaccinations. Please arrange for her to have intrajugular liver biopsy (dx: cirrhosis in setting of etoh and abnormal immunoglobins/anti smooth muscle antibiody, r/o autoimmune component). Schedule her OV with RMR for June 2013. Currently she is on recall list but list schedule her now.  ______

## 2012-04-09 ENCOUNTER — Other Ambulatory Visit: Payer: Self-pay | Admitting: Gastroenterology

## 2012-04-09 ENCOUNTER — Other Ambulatory Visit: Payer: Self-pay

## 2012-04-09 DIAGNOSIS — D509 Iron deficiency anemia, unspecified: Secondary | ICD-10-CM

## 2012-04-09 DIAGNOSIS — K7460 Unspecified cirrhosis of liver: Secondary | ICD-10-CM

## 2012-04-10 ENCOUNTER — Telehealth: Payer: Self-pay

## 2012-04-10 NOTE — Telephone Encounter (Signed)
I am going to try to call Dr. Michelle Nasuti office on Friday to see about them getting any vaccinae. We are waiting on the X-ray department to contact Crystal about her bx

## 2012-04-10 NOTE — Telephone Encounter (Signed)
Pt called about wanting to know about her Hep A vaccination. [facility] and they said it would cost her $70.00. I told her and she stated they she would call her PCP to see when they would get more in and she would let me know what she was going to do.

## 2012-04-11 ENCOUNTER — Telehealth (HOSPITAL_COMMUNITY): Payer: Self-pay

## 2012-04-11 ENCOUNTER — Other Ambulatory Visit: Payer: Self-pay | Admitting: Radiology

## 2012-04-11 NOTE — Telephone Encounter (Signed)
Dr, Fredia Sorrow reviewed the pts chart.  He has approved the bx.  I am calling the pt to set this up.

## 2012-04-11 NOTE — Telephone Encounter (Signed)
Spoke with pt and advised her of her appt

## 2012-04-12 NOTE — Telephone Encounter (Signed)
Pt is aware that Dr. Michelle Nasuti office does not order them and she will have to go to the health department to receive the Hep A vaccination. She is going to wait until after her bx. She will call me so that I can get her an order to take with her.

## 2012-04-15 ENCOUNTER — Telehealth (HOSPITAL_COMMUNITY): Payer: Self-pay

## 2012-04-15 ENCOUNTER — Encounter (HOSPITAL_COMMUNITY): Payer: Self-pay | Admitting: Pharmacy Technician

## 2012-04-15 NOTE — Telephone Encounter (Signed)
Pt called in asking if she could have a Hep a shot the day prior to her BX.  Per Dr. Deanne Coffer, the shot will not affect anything dealing with the BX.

## 2012-04-16 ENCOUNTER — Other Ambulatory Visit: Payer: Self-pay | Admitting: Gastroenterology

## 2012-04-16 ENCOUNTER — Encounter (HOSPITAL_COMMUNITY): Payer: Self-pay

## 2012-04-16 ENCOUNTER — Ambulatory Visit (HOSPITAL_COMMUNITY)
Admission: RE | Admit: 2012-04-16 | Discharge: 2012-04-16 | Disposition: A | Discharge status: Home / Self Care | Payer: Managed Care, Other (non HMO) | Source: Ambulatory Visit | Attending: Gastroenterology | Admitting: Gastroenterology

## 2012-04-16 ENCOUNTER — Telehealth: Payer: Self-pay

## 2012-04-16 DIAGNOSIS — K7460 Unspecified cirrhosis of liver: Secondary | ICD-10-CM

## 2012-04-16 DIAGNOSIS — R188 Other ascites: Secondary | ICD-10-CM | POA: Insufficient documentation

## 2012-04-16 DIAGNOSIS — K766 Portal hypertension: Secondary | ICD-10-CM | POA: Insufficient documentation

## 2012-04-16 HISTORY — PX: LIVER BIOPSY: SHX301

## 2012-04-16 LAB — CBC
MCH: 26.4 pg (ref 26.0–34.0)
MCV: 80.7 fL (ref 78.0–100.0)
Platelets: 228 10*3/uL (ref 150–400)
WBC: 5.6 10*3/uL (ref 4.0–10.5)

## 2012-04-16 LAB — APTT: aPTT: 41 seconds — ABNORMAL HIGH (ref 24–37)

## 2012-04-16 MED ORDER — MIDAZOLAM HCL 5 MG/5ML IJ SOLN
INTRAMUSCULAR | Status: AC | PRN
  Administered 2012-04-16: 0.5 mg via INTRAVENOUS
  Administered 2012-04-16: 1 mg via INTRAVENOUS
  Administered 2012-04-16: 0.5 mg via INTRAVENOUS
  Administered 2012-04-16: 1 mg via INTRAVENOUS

## 2012-04-16 MED ORDER — FENTANYL CITRATE 0.05 MG/ML IJ SOLN
INTRAMUSCULAR | Status: AC | PRN
  Administered 2012-04-16 (×2): 25 ug via INTRAVENOUS
  Administered 2012-04-16: 50 ug via INTRAVENOUS

## 2012-04-16 MED ORDER — SODIUM CHLORIDE 0.9 % IV SOLN
Freq: Once | INTRAVENOUS | Status: AC
  Administered 2012-04-16: 08:00:00 via INTRAVENOUS

## 2012-04-16 MED ORDER — IOHEXOL 300 MG/ML  SOLN
100.0000 mL | Freq: Once | INTRAMUSCULAR | Status: AC | PRN
  Administered 2012-04-16: 30 mL via INTRAVENOUS

## 2012-04-16 MED ORDER — OXYCODONE HCL 5 MG PO TABS: 5.0000 mg | ORAL_TABLET | ORAL | Status: DC | PRN

## 2012-04-16 MED ORDER — FENTANYL CITRATE 0.05 MG/ML IJ SOLN
INTRAMUSCULAR | Status: AC
  Filled 2012-04-16: qty 4

## 2012-04-16 MED ORDER — MIDAZOLAM HCL 2 MG/2ML IJ SOLN
INTRAMUSCULAR | Status: AC
  Filled 2012-04-16: qty 4

## 2012-04-16 NOTE — Discharge Instructions (Signed)
Biopsy A biopsy is a procedure in which small samples of tissue are removed from the body. The tissue is examined under a microscope. A biopsy may be done to determine the cause (diagnosis) of a condition or mass (tumor). A biopsy may also be done to determine the best treatment for you. In some instances, a biopsy may be performed on normal tissue to determine if cancer has spread or if a transplanted organ is being rejected. There are 2 ways to obtain samples:  Fine needle biopsy. Samples are removed using a thin needle inserted through the skin.   Open biopsy. Samples are removed after a cut (incision) is made through the skin.  LET YOUR CAREGIVER KNOW ABOUT:  Allergies to food or medicine.   Medicines taken, including vitamins, herbs, eyedrops, over-the-counter medicines, and creams.   Use of steroids (by mouth or creams).   Previous problems with anesthetics or numbing medicines.   History of bleeding problems or blood clots.   Previous surgery.   Other health problems, including diabetes and kidney problems.   Possibility of pregnancy, if this applies.  RISKS AND COMPLICATIONS  Bleeding from the biopsy site. The risk of bleeding is higher if you have a bleeding disorder or are taking any blood thinning medicines (anticoagulants).   Infection.   Injury to organs or structures near the biopsy site.   Chronic pain at the biopsy site. This is defined as pain that lasts for more than 3 months.   Very rarely, a second biopsy may be required if not enough tissue was collected during the first biopsy.  BEFORE THE PROCEDURE Ask your caregiver what time you need to arrive for your procedure. Ask your caregiver whether you need to stop eating or drinking (fast) before your procedure. Ask your caregiver about changing or stopping your regular medicines. A blood sample may be done to determine your blood clotting time. Medicine may be given to help you relax (sedative). PROCEDURE During  a fine needle biopsy, you will be awake during the procedure. You will be positioned to allow the best possible access to the biopsy site. Let your caregiver know if the position is not comfortable. The biopsy site will be cleaned. A needle is inserted through your skin. You may feel mild discomfort during this procedure. The needle is withdrawn once tissue samples have been removed. Pressure may be applied to the biopsy site to reduce swelling and to ensure that bleeding has stopped. The samples will be sent to be examined. During an open biopsy, you may be given medicine that numbs the area (local anesthetic) or medicine that makes you sleep (general anesthetic). An incision is made through the skin. A tissue sample or the entire mass is removed. The sample or mass will be sent to be examined. Sometimes, the sample or mass may be examined during the procedure. If the sample or mass contains cancer cells, further tissue or structures may be removed. The incision is then closed with stitches (sutures) or skin glue (adhesive). AFTER THE PROCEDURE Your recovery will be assessed and monitored. If there are no problems, you should be able to go home shortly after the procedure (outpatient). You will need to arrange for someone to drive you home if you received a sedative or pain relieving medicine during the procedure. Ask when your test results will be ready. Make sure you get your test results. Document Released: 11/10/2000 Document Revised: 11/02/2011 Document Reviewed: 05/11/2011 [employer] Patient Information 2012 [employer]. 

## 2012-04-16 NOTE — Telephone Encounter (Signed)
Pt had intrajugular liver biopsy today and she would like to know if we could call her in some pain medication. Please advise

## 2012-04-16 NOTE — ED Notes (Signed)
Hepatic wedge pressure 20, free hepatic pressure 13

## 2012-04-16 NOTE — Procedures (Signed)
Successful transjugular liver biopsy.  5 cores obtained and placed specimens in saline.  Hepatic vein pressures obtained. No immediate complication.

## 2012-04-16 NOTE — ED Notes (Signed)
Hepatic wedge pressure 10, free hepatic pressure 5

## 2012-04-16 NOTE — H&P (Signed)
Chief Complaint: Cirrhosis HPI: Katrina Roberts is an 46 y.o. female who is referred for transvenous liver biopsy. She has history of EtOH liver disease and cirrhosis.  Past Medical History:  Past Medical History  Diagnosis Date  . Anxiety   . Depression   . Smoker   . GERD (gastroesophageal reflux disease)   . Headache   . Alcoholic hepatitis   . Liver disease     etoh and +/-autoimmune (h/o elevated immunoglobulins and mildly elevated antismooth muscle ab  . Anemia   . Cirrhosis 02/2012    MRI  . Elevated AFP 02/2012    12  . Asymptomatic cholelithiasis   . Anemia     Past Surgical History:  Past Surgical History  Procedure Date  . Esophagogastroduodenoscopy 01/25/2012    Reflux esophagitis, no varices, ?short segment Barrett's, antral erosions. Bx showed reactive gastropathy. No H.Pylor or barrett's.  Procedure: ESOPHAGOGASTRODUODENOSCOPY (EGD);  Surgeon: Corbin Ade, MD;  Location: [facility];  Service: Endoscopy;  Laterality: N/A;    . Colonoscopy 06/2010    Dr. Mathis Bud edematous colon diffusely with diffuse friability, segmental biopsies  unremarkable    Family History:  Family History  Problem Relation Age of Onset  . Breast cancer Mother   . Lung cancer Father   . Cancer Paternal Aunt     unknown primary  . Colon cancer Neg Hx   . Liver disease Neg Hx     Social History:  reports that she has been smoking Cigarettes.  She has been smoking about .25 packs per day. She does not have any smokeless tobacco history on file. She reports that she does not drink alcohol or use illicit drugs.  Allergies:  Allergies  Allergen Reactions  . Penicillins Swelling    Medications: ALPRAZolam (XANAX) 1 MG tablet 01/31/2012 Sig - Route: Take 1 mg by mouth at bedtime as needed. For anxiety - Oral Class: Historical Med Number of times this order has been changed since signing: 4 Order [address] FLUoxetine (PROZAC) 20 MG tablet 02/26/2012 Sig - Route: Take 30 mg by mouth  daily. - Oral Class: Historical Med Number of times this order has been changed since signing: 1 Order [address] ibuprofen (ADVIL,MOTRIN) 200 MG tablet Sig - Route: Take 200 mg by mouth daily as needed. For pain - Oral Class: Historical Med Number of times this order has been changed since signing: 2 Order [address] spironolactone (ALDACTONE) 25 MG tablet 30 tablet 5 01/27/2012 01/26/2013 Sig - Route: Take 1 tablet (25 mg total) by mouth daily. - Oral Class: Print Number of times this order has been changed since signing: 1 Order [address] furosemide (LASIX) 40 MG tablet (Discontinued) 01/27/2012 04/15/2012 Sig - Route: Take 20 mg by mouth daily. - Oral Class: Historical Med Number of times this order has been changed since signing: 2 Order [address] pantoprazole (PROTONIX) 40 MG tablet (Discontinued) 30 tablet 5 01/27/2012 04/15/2012 Sig - Route: Take 1 tablet (40 mg total) by mouth daily. - Oral Class: Print potassium chloride SA (K-DUR,KLOR-CON) 20 MEQ tablet (Discontinued) 02/02/2012 04/15/2012 Sig - Route: Take 20 mEq by mouth 2 (two) times daily. - Oral Class: Historical Med   Please HPI for pertinent positives, otherwise complete 10 system ROS negative.  Physical Exam: Blood pressure 93/56, pulse 80, temperature 97.4 F (36.3 C), temperature source Oral, resp. rate 18, height 5\' 4"  (1.626 m), weight 122 lb (55.339 kg), SpO2 100.00%. Body mass index is 20.94 kg/(m^2).  General Appearance:  Alert, cooperative, no distress, appears stated age  Head:  Normocephalic, without obvious abnormality, atraumatic  ENT: Unremarkable  Neck: Supple, symmetrical, trachea midline, no adenopathy, thyroid: not enlarged, symmetric, no tenderness/mass/nodules  Lungs:   Clear to auscultation bilaterally, no w/r/r, respirations unlabored without use of accessory muscles.  Chest Wall:  No tenderness or deformity  Heart:  Regular rate and rhythm, S1, S2 normal, no murmur, rub or gallop. Carotids 2+ without bruit.    Abdomen:   Soft, non-tender, non distended. Bowel sounds active all four quadrants,  no masses, no organomegaly.  Extremities: Extremities normal, atraumatic, no cyanosis or edema  Pulses: 2+ and symmetric  Skin: Skin color, texture, turgor normal, no rashes or lesions  Neurologic: Normal affect, no gross deficits.   No results found for this or any previous visit (from the past 48 hour(s)). No results found.  Assessment/Plan Liver cirrhosis For transvenous liver biopsy today. Reviewed procedure and risks Labs pending this am,. Consent signed in chart.  Brayton El PA-C 04/16/2012, 8:09 AM

## 2012-04-17 ENCOUNTER — Telehealth: Payer: Self-pay | Admitting: Gastroenterology

## 2012-04-17 NOTE — Telephone Encounter (Signed)
Patient called demanding to speak to [facility] regarding medications shes taking, she has questions

## 2012-04-17 NOTE — Telephone Encounter (Signed)
Pt aware.

## 2012-04-17 NOTE — Telephone Encounter (Signed)
Spoke with pt- she is requesting something for pain- she said MD that did bx would not give her anything. She is taking ibuprofen and it helps a little. She said she has a sharp pain when she moves her neck and when she coughs or clears her throat it "throbs like a toothache". She is able to eat and drink. She is rating her pain at a 7

## 2012-04-17 NOTE — Telephone Encounter (Signed)
Dr. Performing procedure should manage immediate post-procedure care

## 2012-04-17 NOTE — Telephone Encounter (Signed)
Patient needs to call interventional radiology and let them know she is having bad pain at neck from intrajugular biopsy done yesterday. They need to evaluate her for possible complication. Discussed with RMR earlier who agreed.

## 2012-04-17 NOTE — Telephone Encounter (Signed)
Ginger spoke with pt. 

## 2012-04-17 NOTE — Telephone Encounter (Signed)
Told Pt what RMR said. Told to keep using the Ibuprofen for the pain.If that does not help then she can call PCP. Pt was ok with that.

## 2012-04-20 LAB — HEPATIC FUNCTION PANEL
AST: 25 U/L (ref 0–37)
Albumin: 2.5 g/dL — ABNORMAL LOW (ref 3.5–5.2)
Total Bilirubin: 0.6 mg/dL (ref 0.3–1.2)

## 2012-04-20 LAB — CBC WITH DIFFERENTIAL/PLATELET
Basophils Absolute: 0 10*3/uL (ref 0.0–0.1)
Basophils Relative: 0 % (ref 0–1)
Eosinophils Absolute: 0.1 10*3/uL (ref 0.0–0.7)
MCH: 26.4 pg (ref 26.0–34.0)
MCHC: 31.9 g/dL (ref 30.0–36.0)
Neutro Abs: 2.3 10*3/uL (ref 1.7–7.7)
Neutrophils Relative %: 48 % (ref 43–77)
Platelets: 215 10*3/uL (ref 150–400)

## 2012-04-20 LAB — PROTIME-INR
INR: 1.19 (ref <1.50)
Prothrombin Time: 15.6 seconds — ABNORMAL HIGH (ref 11.6–15.2)

## 2012-04-20 LAB — AFP TUMOR MARKER: AFP-Tumor Marker: 5.3 ng/mL (ref 0.0–8.0)

## 2012-04-23 ENCOUNTER — Telehealth: Payer: Self-pay

## 2012-04-23 NOTE — Telephone Encounter (Signed)
Pt called wanting to know the results of her liver Bx.Please advise

## 2012-04-24 NOTE — Telephone Encounter (Signed)
Pt is aware that RMR is out of town and will not be back until June 11.

## 2012-04-24 NOTE — Telephone Encounter (Signed)
Please let patient know that RMR out of town and I will need to address it with him. Biopsy inconclusive due to small samplings.

## 2012-04-29 ENCOUNTER — Encounter: Payer: Self-pay | Admitting: Internal Medicine

## 2012-04-29 NOTE — Progress Notes (Signed)
Quick Note:  H/H stable. Should be on iron now. CBC/ferritin scheduled for future. Let's make it eight weeks from now. RMR addressed liver biopsy already. She is supposed to see RMR this month. Please make her appt. I requested her appt be made on 03/27/12 but she is still just on recall list. Let's get it scheduled please, RMR only. ______

## 2012-04-29 NOTE — Progress Notes (Signed)
Liver biopsy results never sent to me. I have reviewed bx report. Very poor sampling.  Some fibrosis and no significant iron. Inconclusive as far as bile ducts/ autoimmune component is concerned; last 2 sets of lfts -normal transaminases and ap.  Also, last afp ok.  I recommend ov as scheduled. lfts just before this months visit. If ok, I would just plan to send Katrina Roberts to transplant center later this year.  Please let Katrina Roberts know.

## 2012-04-30 ENCOUNTER — Other Ambulatory Visit: Payer: Self-pay | Admitting: Internal Medicine

## 2012-04-30 ENCOUNTER — Other Ambulatory Visit: Payer: Self-pay

## 2012-04-30 ENCOUNTER — Other Ambulatory Visit: Payer: Self-pay | Admitting: Gastroenterology

## 2012-04-30 DIAGNOSIS — K7460 Unspecified cirrhosis of liver: Secondary | ICD-10-CM

## 2012-04-30 DIAGNOSIS — D509 Iron deficiency anemia, unspecified: Secondary | ICD-10-CM

## 2012-04-30 NOTE — Progress Notes (Signed)
Pt aware, lab order in mail to pt.  

## 2012-04-30 NOTE — Progress Notes (Signed)
Tried to call pt- LMOM 

## 2012-05-17 LAB — HEPATIC FUNCTION PANEL
AST: 24 U/L (ref 0–37)
Alkaline Phosphatase: 99 U/L (ref 39–117)
Bilirubin, Direct: 0.2 mg/dL (ref 0.0–0.3)
Total Bilirubin: 0.6 mg/dL (ref 0.3–1.2)

## 2012-05-21 ENCOUNTER — Ambulatory Visit (INDEPENDENT_AMBULATORY_CARE_PROVIDER_SITE_OTHER): Payer: Managed Care, Other (non HMO) | Admitting: Internal Medicine

## 2012-05-21 ENCOUNTER — Encounter: Payer: Self-pay | Admitting: Internal Medicine

## 2012-05-21 VITALS — BP 110/67 | HR 80 | Temp 98.0°F | Ht 64.0 in | Wt 127.2 lb

## 2012-05-21 DIAGNOSIS — K7460 Unspecified cirrhosis of liver: Secondary | ICD-10-CM

## 2012-05-21 NOTE — Patient Instructions (Addendum)
Add immunoglobulins, AMA, ferritin, cbc, to end of the month labs  1. Office visit in 3 months

## 2012-05-21 NOTE — Progress Notes (Signed)
Primary Care Physician:  Milana Obey, MD Primary Gastroenterologist:  Dr. Jena Gauss  Pre-Procedure History & Physical: HPI:  Katrina Roberts is a 46 y.o. female here for followup history of hepatitis. History of EtOH abuse but has been abstinent for some time. Recent hepatic profile lipid except for an albumin of 2.9. MRI of the liver demonstrates a cirrhotic appearing liver but no focal lesions. Alpha-fetoprotein mildly elevated previously at 12. Mildly elevated immunoglobulins as well previously and weakly positive AMA. Patient underwent a liver biopsy via the external jugular approach fragments of liver were not adequate for pathological interpretation.  Repeat workup thus far with the patient the patient's brother. Fibrosis only a liver biopsy. No iron. It's good to see that her aminotransferases and Ticlid phosphatase have completely normalized. Also albumin is creeping back up. Not during hungry much of the time. She does not get much exercise.  Past Medical History  Diagnosis Date  . Anxiety   . Depression   . Smoker   . GERD (gastroesophageal reflux disease)   . Headache   . Alcoholic hepatitis   . Liver disease     etoh and +/-autoimmune (h/o elevated immunoglobulins and mildly elevated antismooth muscle ab  . Anemia   . Cirrhosis 02/2012    MRI, afp 04/19/12= 5.3, U/S 02/29/12= no liver tumors, immune to Hep B.   . Elevated AFP 02/2012    12  . Asymptomatic cholelithiasis   . Anemia   . Hiatal hernia     small    Past Surgical History  Procedure Date  . Esophagogastroduodenoscopy 01/25/2012    Reflux esophagitis, no varices, ?short segment Barrett's, antral erosions. Bx showed reactive gastropathy. No H.Pylor or barrett's.  Procedure: ESOPHAGOGASTRODUODENOSCOPY (EGD);  Surgeon: Corbin Ade, MD;  Location: [facility];  Service: Endoscopy;  Laterality: N/A;    . Colonoscopy 06/2010    Dr. Mathis Bud edematous colon diffusely with diffuse friability, segmental biopsies   unremarkable  . Liver biopsy 04/16/12    poor sample, some fibrosis and no significant iron, inconclusive as far as bile ducts/autoimmune component is concerned    Prior to Admission medications   Medication Sig Start Date End Date Taking? Authorizing Provider  ALPRAZolam Prudy Feeler) 1 MG tablet Take 1 mg by mouth at bedtime as needed. For anxiety 01/31/12   Historical Provider, MD  ferrous sulfate 325 (65 FE) MG tablet Take 325 mg by mouth 2 (two) times daily.    Historical Provider, MD  FLUoxetine (PROZAC) 20 MG tablet Take 30 mg by mouth daily. 02/26/12   Historical Provider, MD  ibuprofen (ADVIL,MOTRIN) 200 MG tablet Take 200 mg by mouth daily as needed. For pain    Historical Provider, MD  spironolactone (ALDACTONE) 25 MG tablet Take 1 tablet (25 mg total) by mouth daily. 01/27/12 01/26/13  Milana Obey, MD    Allergies as of 05/21/2012 - Review Complete 04/16/2012  Allergen Reaction Noted  . Penicillins Swelling     Family History  Problem Relation Age of Onset  . Breast cancer Mother   . Lung cancer Father   . Cancer Paternal Aunt     unknown primary  . Colon cancer Neg Hx   . Liver disease Neg Hx     History   Social History  . Marital Status: Married    Spouse Name: N/A    Number of Children: 0  . Years of Education: N/A   Occupational History  . Not on file.   Social History Main  Topics  . Smoking status: Current Everyday Smoker -- 0.2 packs/day    Types: Cigarettes  . Smokeless tobacco: Not on file  . Alcohol Use: No     no alcohol since 01/19/2012  . Drug Use: No  . Sexually Active: Yes    Birth Control/ Protection: Post-menopausal   Other Topics Concern  . Not on file   Social History Narrative  . No narrative on file    Review of Systems: See HPI, otherwise negative ROS  Physical Exam: There were no vitals taken for this visit. General:   Alert,  Well-developed, well-nourished, pleasant and cooperative in NAD Skin:  Intact without significant  lesions or rashes. Eyes:  Sclera clear, no icterus.   Conjunctiva pink. Ears:  Normal auditory acuity. Nose:  No deformity, discharge,  or lesions. Mouth:  No deformity or lesions. Neck:  Supple; no masses or thyromegaly. No significant cervical adenopathy. Lungs:  Clear throughout to auscultation.   No wheezes, crackles, or rhonchi. No acute distress. Heart:  Regular rate and rhythm; no murmurs, clicks, rubs,  or gallops. Abdomen: Non-distended, normal bowel sounds.  Soft and nontender. Liver edge percusses/palpated to the right costal margin.  Pulses:  Normal pulses noted. Extremities:  Without clubbing or edema.  Impression/Plan: Recent illness most likely largely related to alcoholoic hepatitis. She has not gotten any third party help for alcohol abuse but reports being completely abstinent since her last hospitalization. She has a supportive family.  Even though fibrosis/cirrhosis not reversible,  she likely has a fair amount of preserved hepatic synthetic function. Not mentioned above, she is finishing up her hepatitis A vaccine series. History of elevated alpha-fetoprotein tumor to be repeated at the end of month  I do not feel strongly we need to pursue another liver biopsy at this time, particularly in view of normal aminotransferases and alkaline phosphatase. I would like to get a repeat set of  immunoglobulins and an AMA just to see if they revert to normal. This would be very reassuring. She will need  hepatoma surveillance ongoing  Unless something comes up, we'll plan to see her back in the office in 3 months  Recommendations: Repeat alpha-fetoprotein, CBC, immunoglobulins and antimitochondrial antibody in one month.  Alpha fetoproteins every 6 months and hepatic ultrasound at the same interval at a minimum. Further recommendations to follow once labs are available for review.

## 2012-05-23 NOTE — Progress Notes (Signed)
Reminder in epic to follow up in 3 months with ext per RMR

## 2012-06-22 LAB — CBC WITH DIFFERENTIAL/PLATELET
Basophils Absolute: 0 10*3/uL (ref 0.0–0.1)
HCT: 39.9 % (ref 36.0–46.0)
Hemoglobin: 13.2 g/dL (ref 12.0–15.0)
Lymphocytes Relative: 39 % (ref 12–46)
Monocytes Absolute: 0.4 10*3/uL (ref 0.1–1.0)
Monocytes Relative: 9 % (ref 3–12)
Neutro Abs: 2.2 10*3/uL (ref 1.7–7.7)
RBC: 4.3 MIL/uL (ref 3.87–5.11)
WBC: 4.5 10*3/uL (ref 4.0–10.5)

## 2012-06-25 LAB — MITOCHONDRIAL ANTIBODIES: Mitochondrial M2 Ab, IgG: 0.4 (ref <0.91)

## 2012-06-26 ENCOUNTER — Telehealth: Payer: Self-pay

## 2012-06-26 NOTE — Telephone Encounter (Signed)
Pt called wanting to know about her blood work she had done Friday. Can you look over them.

## 2012-06-27 NOTE — Telephone Encounter (Signed)
Pt aware of results and the RMR is out of town

## 2012-06-27 NOTE — Telephone Encounter (Signed)
Looked at labs ordered by RMR. You can let patient know that her H/H are now normal!!! Immunoglobulins (were up before) are now almost normal.  Dr. Jena Gauss will be addressing and making further recommendations.

## 2012-06-27 NOTE — Progress Notes (Signed)
Quick Note:  Her ferritine is better. Continue iron for 3 months then stop. ______

## 2012-06-30 IMAGING — XA IR TRANSCATHETER BIOPSY
1 series · 12 of 22 positions shown · non-contrast
Comparison: none

CLINICAL HISTORY: 45-year-old with cirrhosis and ascites.  Liver
biopsy is needed.

[Series 1: run · 12 of 22 slices shown]
[im 1/22]
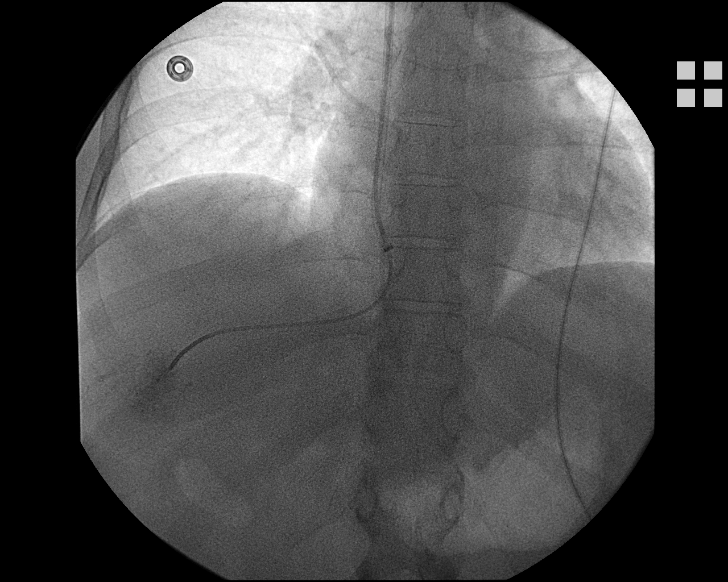
[im 3/22]
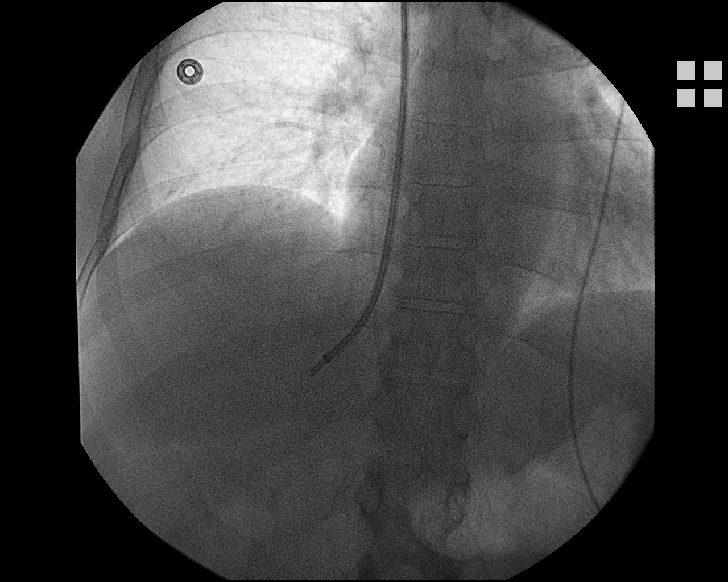
[im 5/22]
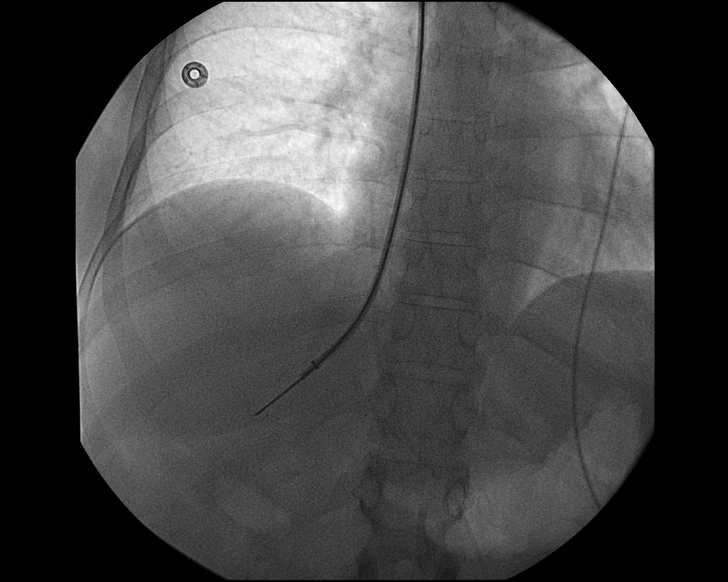
[im 7/22]
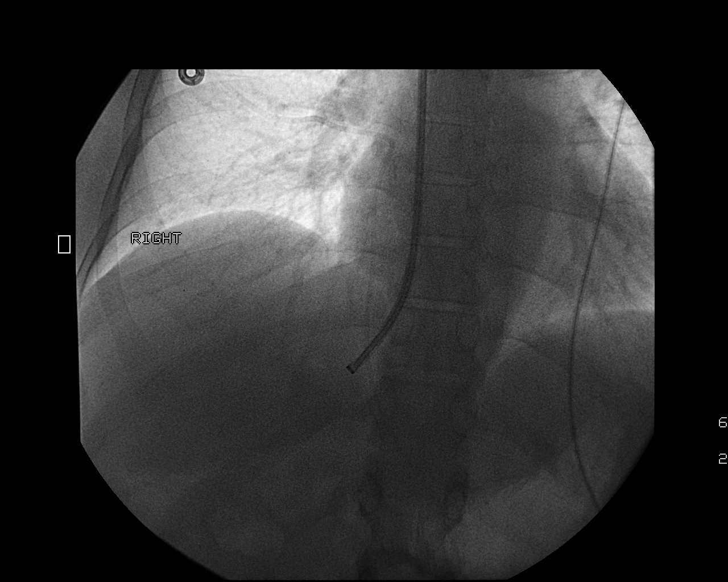
[im 9/22]
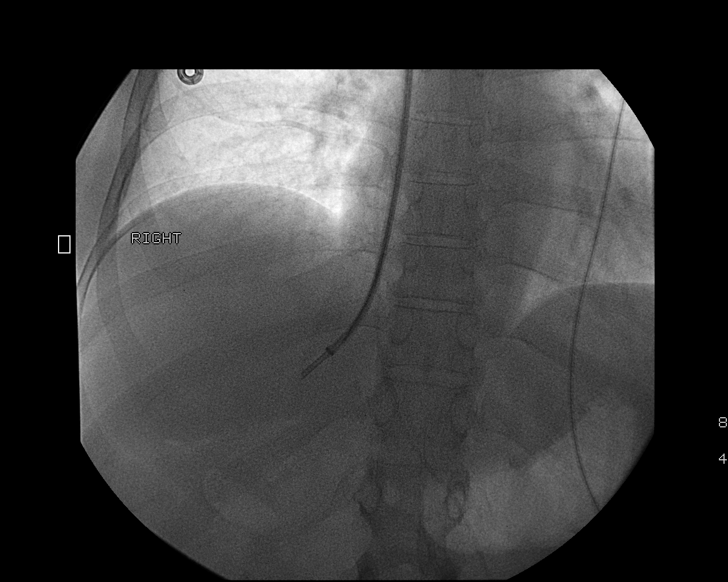
[im 11/22]
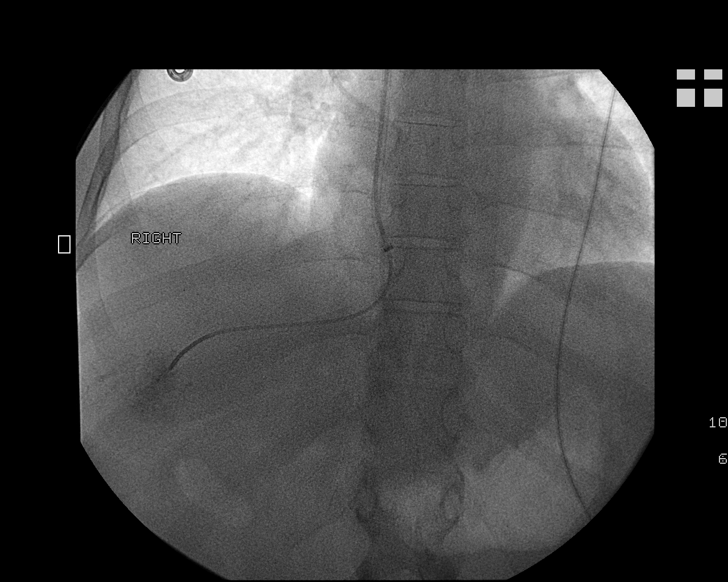
[im 12/22]
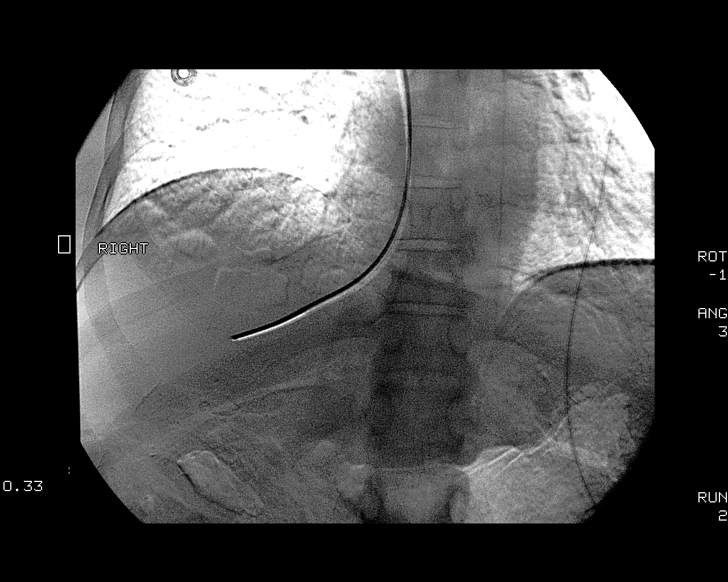
[im 14/22]
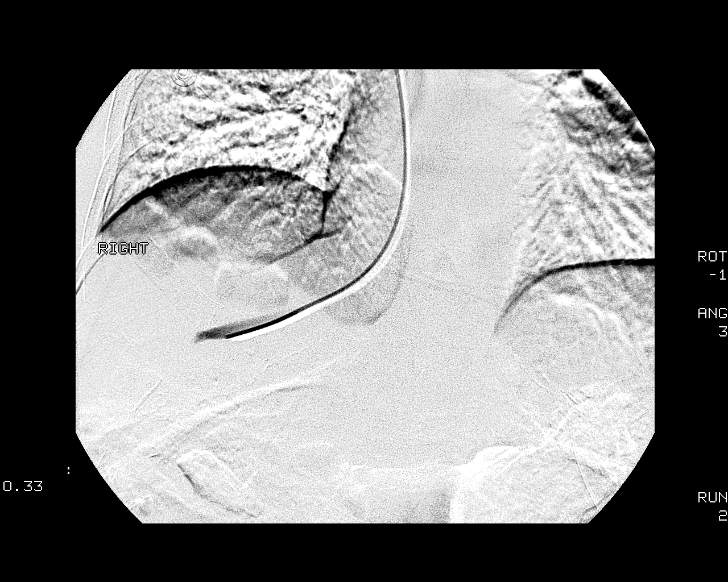
[im 16/22]
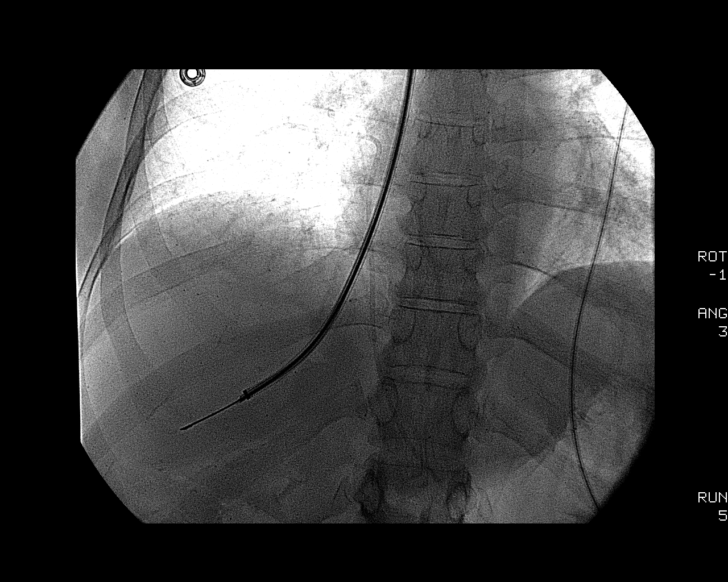
[im 18/22]
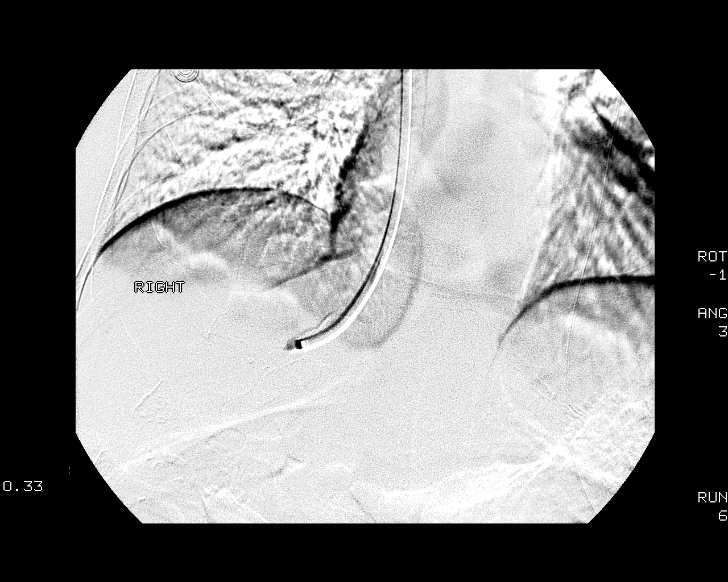
[im 20/22]
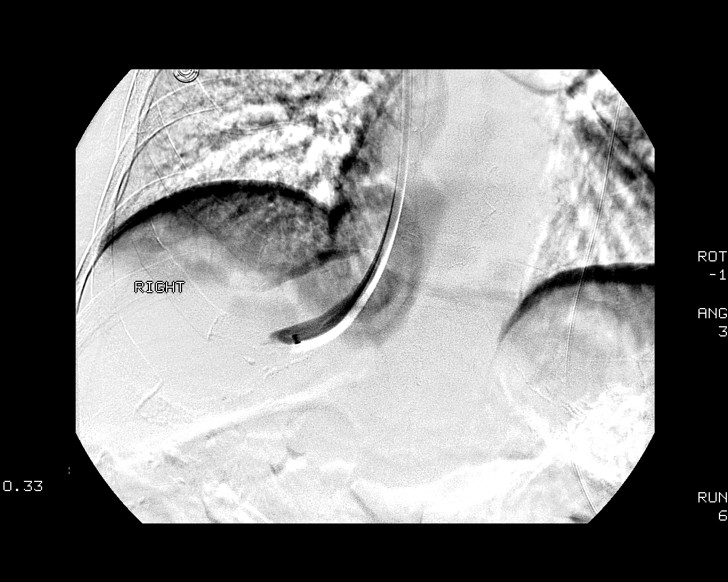
[im 22/22]
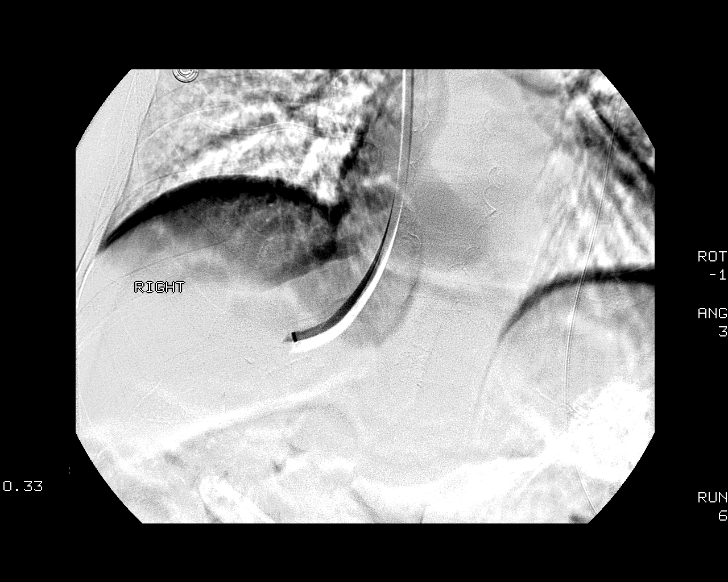

[12 of 22 positions shown; findings below may reference images not displayed]

PROCEDURE(S): TRANSJUGULAR LIVER BIOPSY; ULTRASOUND GUIDANCE FOR
vascular access; hepatic venography and hepatic venous pressures

Medications:Versed 2.5 mg, Fentanyl 100 mcg. A radiology nurse
monitored the patient for moderate sedation.

Moderate sedation time:30 minutes

Fluoroscopy time: 11.1 minutes

Contrast:  30 ml [id number]

Procedure:The procedure was explained to the patient.  The risks
and benefits of the procedure were discussed and the patient's
questions were addressed.  Informed consent was obtained from the
patient.  The patient was placed supine.  Ultrasound confirmed a
patent right internal jugular vein.  The right side of the neck was
prepped and draped in a sterile fashion.  Skin was anesthetized
with 1% lidocaine.  21 gauge needle directed into the right
internal jugular vein with ultrasound guidance and a dilator set
was placed.  A 9-French vascular sheath was advanced into the IVC
over a Bentson wire.  MPA catheter was used to cannulate the right
hepatic vein.  Hepatic venography was performed.  Hepatic vein
wedge and free hepatic vein pressures were obtained.

The transjugular biopsy sheath was advanced through the 9-French
sheath over a stiff Amplatz wire.  A total of five core biopsies
were obtained with the transjugular biopsy device.  The biopsy
sheath was removed.  The 9-French sheath was removed with manual
compression and occlusive dressing.
FINDINGS: Liver appears small and right hepatic vein has a
transverse orientation.  The [DATE] of hepatic vein pressures
were 10 mmHg (wedged) and 5 mmHg (free).  The second pressures were
20 mmHg (wedged) and 13 mmHg (free).  No gross abnormality to the
hepatic veins before or after the biopsies.

Complications: None
IMPRESSION: Successful transjugular liver biopsy.

Hepatic vein gradient is greater than 5 mmHg.  Findings are
compatible with portal hypertension.

## 2012-08-16 ENCOUNTER — Encounter: Payer: Self-pay | Admitting: Internal Medicine

## 2012-09-03 ENCOUNTER — Ambulatory Visit (HOSPITAL_COMMUNITY)
Admission: RE | Admit: 2012-09-03 | Discharge: 2012-09-03 | Disposition: A | Discharge status: Home / Self Care | Payer: Managed Care, Other (non HMO) | Source: Ambulatory Visit | Attending: Gastroenterology | Admitting: Gastroenterology

## 2012-09-03 ENCOUNTER — Telehealth: Payer: Self-pay | Admitting: Internal Medicine

## 2012-09-03 ENCOUNTER — Ambulatory Visit (INDEPENDENT_AMBULATORY_CARE_PROVIDER_SITE_OTHER): Payer: Managed Care, Other (non HMO) | Admitting: Gastroenterology

## 2012-09-03 ENCOUNTER — Encounter: Payer: Self-pay | Admitting: Gastroenterology

## 2012-09-03 ENCOUNTER — Other Ambulatory Visit: Payer: Self-pay | Admitting: Gastroenterology

## 2012-09-03 VITALS — BP 102/68 | HR 86 | Temp 97.7°F | Ht 64.0 in | Wt 146.4 lb

## 2012-09-03 DIAGNOSIS — K8020 Calculus of gallbladder without cholecystitis without obstruction: Secondary | ICD-10-CM | POA: Insufficient documentation

## 2012-09-03 DIAGNOSIS — F1020 Alcohol dependence, uncomplicated: Secondary | ICD-10-CM

## 2012-09-03 DIAGNOSIS — K7460 Unspecified cirrhosis of liver: Secondary | ICD-10-CM

## 2012-09-03 NOTE — Telephone Encounter (Signed)
Pt needs ov please  

## 2012-09-03 NOTE — Progress Notes (Signed)
Primary Care Physician: Milana Obey, MD  Primary Gastroenterologist:  Roetta Sessions, MD   Chief Complaint  Patient presents with  . Abdominal Pain    HPI: Katrina Roberts is a 46 y.o. female here for same day appointment. She wants to have an abdominal ultrasound to evaluate for ascites.   Last seen in 04/2012 by Dr. Jena Gauss. H/O ETOH abuse (none since 12/2011), etoh hepatitis, cirrhosis. Liver bx via external jugular approach, fragments of liver were not adequate for pathological interpretation. Fibrosis only on liver bx. No iron.   Walking daily since June 2013. Walking two miles a day. No etoh since 12/2011. Appetite good. Cooks meals at home. Eats well. Craves sugar. Drinks lot of fluids, frequent urination. No longer on fluid pills for several months. Trying to wean off Prozac. She is frustrated by her increasing weight, giving dietary changes and daily exercising. She feels great however. Her weight is up 20 pounds in 04/2012.   Denies heartburn, diarrhea, constipation, vomiting, melena, brbpr.   Current Outpatient Prescriptions  Medication Sig Dispense Refill  . ALPRAZolam (XANAX) 1 MG tablet Take 1 mg by mouth at bedtime as needed. For anxiety      . FLUoxetine (PROZAC) 20 MG tablet Take 10 mg by mouth daily.      Marland Kitchen ibuprofen (ADVIL,MOTRIN) 200 MG tablet Take 200 mg by mouth daily as needed. For pain        Allergies as of 09/03/2012 - Review Complete 09/03/2012  Allergen Reaction Noted  . Penicillins Swelling     ROS:  General: Negative for anorexia, weight loss, fever, chills, fatigue, weakness. ENT: Negative for hoarseness, difficulty swallowing , nasal congestion. CV: Negative for chest pain, angina, palpitations, dyspnea on exertion, peripheral edema.  Respiratory: Negative for dyspnea at rest, dyspnea on exertion, cough, sputum, wheezing.  GI: See history of present illness. GU:  Negative for dysuria, hematuria. C/O urinary frequency.  Endo: Negative for unusual  weight change.    Physical Examination:   BP 102/68  Pulse 86  Temp 97.7 F (36.5 C) (Temporal)  Ht 5\' 4"  (1.626 m)  Wt 146 lb 6.4 oz (66.407 kg)  BMI 25.13 kg/m2  General: Well-nourished, well-developed in no acute distress.  Eyes: No icterus. Mouth: Oropharyngeal mucosa moist and pink , no lesions erythema or exudate. Lungs: Clear to auscultation bilaterally.  Heart: Regular rate and rhythm, no murmurs rubs or gallops.  Abdomen: Bowel sounds are normal, nontender, no fluid wave, nondistended, liver edge easily palpable in ruq,or masses, no abdominal bruits or hernia , no rebound or guarding.   Extremities: No lower extremity edema. No clubbing or deformities. Neuro: Alert and oriented x 4   Skin: Warm and dry, no jaundice.   Psych: Alert and cooperative, normal mood and affect.

## 2012-09-03 NOTE — Telephone Encounter (Signed)
She now is stating she just want an U/S Im not sure exactly what shes asking for

## 2012-09-03 NOTE — Telephone Encounter (Signed)
Patient is scheduled with LSL today at 11:30

## 2012-09-03 NOTE — Patient Instructions (Addendum)
Please have your blood work done fasting. Nothing to eat or drink after midnight.  Please have your abdominal ultrasound done.  We will call you with results as available.   Congratulations on your sobriety!!!!

## 2012-09-03 NOTE — Progress Notes (Signed)
Faxed to PCP

## 2012-09-03 NOTE — Assessment & Plan Note (Signed)
No etoh use since 12/2011. Clinically she looks the best I've ever seen her. No peripheral edema or ascites noted. She is convinced that her weight gain is due to ascites. She is eating healthy and exercising. She is due for abd u/s for [facility] screening and Dr. Jena Gauss had recommended repeat smooth muscle Ab, LFTs, AFP this month. Will also check her fasting glucose giving complaints of excessive thirst and urination. Continue etoh abstinence. She was congratulated on her sobriety and lifestyle changes.

## 2012-09-03 NOTE — Telephone Encounter (Signed)
[name] left a message on the machine over night sometime wanting Katrina Roberts to call an order over to Radiology for a Katrina Roberts paracentesis and I need permission to do this please advise?

## 2012-09-05 ENCOUNTER — Encounter: Payer: Self-pay | Admitting: Gastroenterology

## 2012-09-05 LAB — COMPREHENSIVE METABOLIC PANEL
ALT: 15 U/L (ref 0–35)
Albumin: 3.8 g/dL (ref 3.5–5.2)
Alkaline Phosphatase: 148 U/L — ABNORMAL HIGH (ref 39–117)
CO2: 24 mEq/L (ref 19–32)
Glucose, Bld: 86 mg/dL (ref 70–99)
Potassium: 4.2 mEq/L (ref 3.5–5.3)
Sodium: 138 mEq/L (ref 135–145)
Total Protein: 7.3 g/dL (ref 6.0–8.3)

## 2012-09-06 NOTE — Progress Notes (Signed)
Quick Note:  Please let patient know: Known gallstone but no evidence of cholecystitis. Liver is stable. No ascites seen.   I discussed with Dondra Prader (u/s) and Dr. Tyron Russell. I am unaware of patient having a TIPS. They will look into it. Patient agreed that she had TIPS when asked but she was likely confused with recent transjugular liver biopsy. ______

## 2012-09-06 NOTE — Progress Notes (Signed)
Quick Note:  Labs ok. Alk phos up a little. Smooth muscle Ab now normal.  Recheck LFTs in three months.  Check AFP in six months.  Abdominal u/s in six months, HCC screen. OV with Dr. Jena Gauss in six months. ______

## 2012-09-11 ENCOUNTER — Other Ambulatory Visit: Payer: Self-pay | Admitting: Gastroenterology

## 2012-09-11 DIAGNOSIS — K7460 Unspecified cirrhosis of liver: Secondary | ICD-10-CM

## 2012-09-11 NOTE — Progress Notes (Signed)
Quick Note:  Pt aware. Lab order on file. Please nic ov and u/s in 6 months. Pt stated she was scheduled to see RMR on 10/04/12, she is feeling good and we can cancel that ov. ______

## 2012-09-11 NOTE — Progress Notes (Signed)
Quick Note:  Pt aware ______ 

## 2012-10-04 ENCOUNTER — Ambulatory Visit: Payer: Managed Care, Other (non HMO) | Admitting: Internal Medicine

## 2012-10-18 ENCOUNTER — Encounter (HOSPITAL_COMMUNITY): Payer: Self-pay | Admitting: *Deleted

## 2012-10-18 ENCOUNTER — Inpatient Hospital Stay (HOSPITAL_COMMUNITY)
Admission: EM | Admit: 2012-10-18 | Discharge: 2012-10-22 | DRG: 392 | Disposition: A | Discharge status: Home / Self Care | Payer: Managed Care, Other (non HMO) | Attending: Family Medicine | Admitting: Family Medicine

## 2012-10-18 DIAGNOSIS — F10239 Alcohol dependence with withdrawal, unspecified: Secondary | ICD-10-CM | POA: Diagnosis present

## 2012-10-18 DIAGNOSIS — K7030 Alcoholic cirrhosis of liver without ascites: Secondary | ICD-10-CM | POA: Diagnosis present

## 2012-10-18 DIAGNOSIS — K754 Autoimmune hepatitis: Secondary | ICD-10-CM | POA: Diagnosis present

## 2012-10-18 DIAGNOSIS — J449 Chronic obstructive pulmonary disease, unspecified: Secondary | ICD-10-CM | POA: Diagnosis present

## 2012-10-18 DIAGNOSIS — K7010 Alcoholic hepatitis without ascites: Secondary | ICD-10-CM | POA: Diagnosis present

## 2012-10-18 DIAGNOSIS — F10939 Alcohol use, unspecified with withdrawal, unspecified: Secondary | ICD-10-CM | POA: Diagnosis present

## 2012-10-18 DIAGNOSIS — F3289 Other specified depressive episodes: Secondary | ICD-10-CM | POA: Diagnosis present

## 2012-10-18 DIAGNOSIS — F1020 Alcohol dependence, uncomplicated: Secondary | ICD-10-CM | POA: Diagnosis present

## 2012-10-18 DIAGNOSIS — R188 Other ascites: Secondary | ICD-10-CM | POA: Diagnosis present

## 2012-10-18 DIAGNOSIS — K2920 Alcoholic gastritis without bleeding: Principal | ICD-10-CM | POA: Diagnosis present

## 2012-10-18 DIAGNOSIS — Z880 Allergy status to penicillin: Secondary | ICD-10-CM

## 2012-10-18 DIAGNOSIS — E860 Dehydration: Secondary | ICD-10-CM | POA: Diagnosis present

## 2012-10-18 DIAGNOSIS — F17200 Nicotine dependence, unspecified, uncomplicated: Secondary | ICD-10-CM | POA: Diagnosis present

## 2012-10-18 DIAGNOSIS — J4489 Other specified chronic obstructive pulmonary disease: Secondary | ICD-10-CM | POA: Diagnosis present

## 2012-10-18 DIAGNOSIS — E876 Hypokalemia: Secondary | ICD-10-CM | POA: Diagnosis present

## 2012-10-18 DIAGNOSIS — Z79899 Other long term (current) drug therapy: Secondary | ICD-10-CM

## 2012-10-18 DIAGNOSIS — R748 Abnormal levels of other serum enzymes: Secondary | ICD-10-CM

## 2012-10-18 DIAGNOSIS — N950 Postmenopausal bleeding: Secondary | ICD-10-CM | POA: Diagnosis present

## 2012-10-18 DIAGNOSIS — F329 Major depressive disorder, single episode, unspecified: Secondary | ICD-10-CM | POA: Diagnosis present

## 2012-10-18 DIAGNOSIS — F411 Generalized anxiety disorder: Secondary | ICD-10-CM | POA: Diagnosis present

## 2012-10-18 DIAGNOSIS — K449 Diaphragmatic hernia without obstruction or gangrene: Secondary | ICD-10-CM | POA: Diagnosis present

## 2012-10-18 DIAGNOSIS — K219 Gastro-esophageal reflux disease without esophagitis: Secondary | ICD-10-CM | POA: Diagnosis present

## 2012-10-18 DIAGNOSIS — D649 Anemia, unspecified: Secondary | ICD-10-CM | POA: Diagnosis present

## 2012-10-18 DIAGNOSIS — R112 Nausea with vomiting, unspecified: Secondary | ICD-10-CM

## 2012-10-18 LAB — COMPREHENSIVE METABOLIC PANEL
ALT: 48 U/L — ABNORMAL HIGH (ref 0–35)
AST: 110 U/L — ABNORMAL HIGH (ref 0–37)
Albumin: 3.9 g/dL (ref 3.5–5.2)
Alkaline Phosphatase: 230 U/L — ABNORMAL HIGH (ref 39–117)
CO2: 30 mEq/L (ref 19–32)
Calcium: 9.8 mg/dL (ref 8.4–10.5)
GFR calc Af Amer: >90 mL/min (ref >90)
Sodium: 136 mEq/L (ref 135–145)
Total Bilirubin: 1.1 mg/dL (ref 0.3–1.2)
Total Protein: 8.7 g/dL — ABNORMAL HIGH (ref 6.0–8.3)

## 2012-10-18 LAB — LIPASE, BLOOD: Lipase: 34 U/L (ref 11–59)

## 2012-10-18 LAB — CBC WITH DIFFERENTIAL/PLATELET
Eosinophils Absolute: 0 10*3/uL (ref 0.0–0.7)
Lymphs Abs: 0.8 10*3/uL (ref 0.7–4.0)
MCH: 32.8 pg (ref 26.0–34.0)
Neutrophils Relative %: 76 % (ref 43–77)
Platelets: 57 10*3/uL — ABNORMAL LOW (ref 150–400)
RBC: 4.82 MIL/uL (ref 3.87–5.11)
WBC: 6.6 10*3/uL (ref 4.0–10.5)

## 2012-10-18 LAB — AMMONIA: Ammonia: 21 umol/L (ref 11–60)

## 2012-10-18 LAB — ETHANOL: Alcohol, Ethyl (B): <11 mg/dL (ref 0–11)

## 2012-10-18 MED ORDER — PANTOPRAZOLE SODIUM 40 MG IV SOLR
40.0000 mg | Freq: Once | INTRAVENOUS | Status: AC
  Administered 2012-10-18: 40 mg via INTRAVENOUS
  Filled 2012-10-18: qty 40

## 2012-10-18 MED ORDER — LORAZEPAM 2 MG/ML IJ SOLN
1.0000 mg | Freq: Once | INTRAMUSCULAR | Status: AC
  Administered 2012-10-18: 1 mg via INTRAVENOUS
  Filled 2012-10-18: qty 1

## 2012-10-18 MED ORDER — POTASSIUM CHLORIDE CRYS ER 20 MEQ PO TBCR
40.0000 meq | EXTENDED_RELEASE_TABLET | Freq: Once | ORAL | Status: AC
  Administered 2012-10-18: 40 meq via ORAL
  Filled 2012-10-18: qty 2

## 2012-10-18 MED ORDER — PANTOPRAZOLE SODIUM 40 MG PO TBEC
40.0000 mg | DELAYED_RELEASE_TABLET | Freq: Two times a day (BID) | ORAL | Status: DC
  Administered 2012-10-19 – 2012-10-22 (×7): 40 mg via ORAL
  Filled 2012-10-18 (×7): qty 1

## 2012-10-18 MED ORDER — NICOTINE 21 MG/24HR TD PT24
21.0000 mg | MEDICATED_PATCH | Freq: Every day | TRANSDERMAL | Status: DC
  Administered 2012-10-18 – 2012-10-22 (×5): 21 mg via TRANSDERMAL
  Filled 2012-10-18 (×5): qty 1

## 2012-10-18 MED ORDER — ONDANSETRON HCL 4 MG/2ML IJ SOLN
4.0000 mg | Freq: Once | INTRAMUSCULAR | Status: AC
  Administered 2012-10-18: 4 mg via INTRAVENOUS
  Filled 2012-10-18: qty 2

## 2012-10-18 MED ORDER — POTASSIUM CHLORIDE 10 MEQ/100ML IV SOLN
10.0000 meq | INTRAVENOUS | Status: AC
  Administered 2012-10-18 – 2012-10-19 (×4): 10 meq via INTRAVENOUS
  Filled 2012-10-18: qty 300
  Filled 2012-10-18: qty 100

## 2012-10-18 MED ORDER — SODIUM CHLORIDE 0.9 % IV SOLN
Freq: Once | INTRAVENOUS | Status: AC
  Administered 2012-10-18: 20:00:00 via INTRAVENOUS

## 2012-10-18 MED ORDER — POTASSIUM CHLORIDE 10 MEQ/100ML IV SOLN
10.0000 meq | Freq: Once | INTRAVENOUS | Status: AC
  Administered 2012-10-18: 10 meq via INTRAVENOUS
  Filled 2012-10-18: qty 100

## 2012-10-18 MED ORDER — LORAZEPAM 1 MG PO TABS
1.0000 mg | ORAL_TABLET | ORAL | Status: DC | PRN
  Administered 2012-10-18 – 2012-10-19 (×5): 1 mg via ORAL
  Filled 2012-10-18 (×6): qty 1

## 2012-10-18 MED ORDER — FOLIC ACID 1 MG PO TABS
1.0000 mg | ORAL_TABLET | Freq: Every day | ORAL | Status: DC
  Administered 2012-10-18 – 2012-10-22 (×5): 1 mg via ORAL
  Filled 2012-10-18 (×5): qty 1

## 2012-10-18 MED ORDER — SODIUM CHLORIDE 0.9 % IV BOLUS (SEPSIS)
1000.0000 mL | Freq: Once | INTRAVENOUS | Status: AC
  Administered 2012-10-18: 1000 mL via INTRAVENOUS

## 2012-10-18 MED ORDER — ONDANSETRON HCL 4 MG/2ML IJ SOLN
4.0000 mg | Freq: Four times a day (QID) | INTRAMUSCULAR | Status: DC | PRN
  Administered 2012-10-18 – 2012-10-19 (×2): 4 mg via INTRAVENOUS
  Filled 2012-10-18 (×2): qty 2

## 2012-10-18 NOTE — ED Notes (Signed)
Pt c/o abd pain, n/v that started today, pt states that she has thrown up multiple times, denies any diarrhea, but states that she did have black stools yesterday and today,  admits to at least a 12 pack of beer a day for the past two weeks, had been "sober" for 6 months prior to this, dry heaves in room upon arrival from triage, anxious.

## 2012-10-18 NOTE — ED Notes (Signed)
Vomiting , onset today,  Says she has been drinking etoh for 2 weeks,  Had stopped for 6 mos,  Sent by Dr [name].

## 2012-10-18 NOTE — ED Provider Notes (Signed)
History     CSN: [id number]  Arrival date & time 10/18/12  1706   First MD Initiated Contact with Patient 10/18/12 1752      Chief Complaint  Patient presents with  . Emesis    (Consider location/radiation/quality/duration/timing/severity/associated sxs/prior treatment) Patient is a 46 y.o. female presenting with vomiting. The history is provided by the patient.  Emesis   She had resumed drinking 2 weeks ago was drinking 12 beers a day. Last drink was yesterday. Today, she started vomiting and has vomited multiple times. She denies abdominal pain. She denies diarrhea. She denies fever or chills. Nothing makes her nausea better and nothing makes it worse. She states she does have history of DTs but not of alcohol withdrawal seizure. She states she does not wish to be placed in a detox program. She denies drug use other than alcohol. She does still smoke one pack of cigarettes a day.  Past Medical History  Diagnosis Date  . Anxiety   . Depression   . Smoker   . GERD (gastroesophageal reflux disease)   . Headache   . Alcoholic hepatitis   . Liver disease     etoh and +/-autoimmune (h/o elevated immunoglobulins and mildly elevated antismooth muscle ab  . Anemia   . Cirrhosis 02/2012    MRI, afp 04/19/12= 5.3, U/S 02/29/12= no liver tumors, immune to Hep B.   . Elevated AFP 02/2012    12  . Asymptomatic cholelithiasis   . Anemia   . Hiatal hernia     small    Past Surgical History  Procedure Date  . Esophagogastroduodenoscopy 01/25/2012    Reflux esophagitis, no varices, ?short segment Barrett's, antral erosions. Bx showed reactive gastropathy. No H.Pylor or barrett's.  Procedure: ESOPHAGOGASTRODUODENOSCOPY (EGD);  Surgeon: Corbin Ade, MD;  Location: [facility];  Service: Endoscopy;  Laterality: N/A;    . Colonoscopy 06/2010    Dr. Mathis Bud edematous colon diffusely with diffuse friability, segmental biopsies  unremarkable  . Liver biopsy 04/16/12    poor sample, some  fibrosis and no significant iron, inconclusive as far as bile ducts/autoimmune component is concerned    Family History  Problem Relation Age of Onset  . Breast cancer Mother   . Lung cancer Father   . Cancer Paternal Aunt     unknown primary  . Colon cancer Neg Hx   . Liver disease Neg Hx     History  Substance Use Topics  . Smoking status: Current Every Day Smoker -- 0.5 packs/day    Types: Cigarettes  . Smokeless tobacco: Not on file  . Alcohol Use: 0.0 oz/week     Comment: no alcohol since 01/19/2012    OB History    Grav Para Term Preterm Abortions TAB SAB Ect Mult Living                  Review of Systems  Gastrointestinal: Positive for vomiting.  All other systems reviewed and are negative.    Allergies  Penicillins  Home Medications   Current Outpatient Rx  Name  Route  Sig  Dispense  Refill  . ALPRAZOLAM 1 MG PO TABS   Oral   Take 0.5-1 mg by mouth 3 (three) times daily as needed. Takes one-half tablet in the morning and at lunch, then take one tablet at bedtime as needed For anxiety         . FLUOXETINE HCL 20 MG PO TABS   Oral   Take 10-20 mg  by mouth daily.          . IBUPROFEN 200 MG PO TABS   Oral   Take 200 mg by mouth daily as needed. For pain           BP 135/84  Pulse 118  Temp 98.3 F (36.8 C) (Oral)  Resp 18  Ht 5\' 4"  (1.626 m)  Wt 142 lb (64.411 kg)  BMI 24.37 kg/m2  SpO2 96%  Physical Exam  Nursing note and vitals reviewed. 46 year old female, resting comfortably and in no acute distress. Vital signs are significant for tachycardia with heart rate of 118. Oxygen saturation is 96%, which is normal. Head is normocephalic and atraumatic. PERRLA, EOMI. Oropharynx is clear. Neck is nontender and supple without adenopathy or JVD. Back is nontender and there is no CVA tenderness. Lungs are clear without rales, wheezes, or rhonchi. Chest is nontender. Heart has regular rate and rhythm without murmur. Abdomen is soft, flat,  nontender without masses or hepatosplenomegaly and peristalsis is hypoactive. Extremities have no cyanosis or edema, full range of motion is present. Skin is warm and dry without rash. Neurologic: Mental status is normal, cranial nerves are intact, there are no motor or sensory deficits. Mild tremulousness is present.   ED Course  Procedures (including critical care time)  Results for orders placed during the hospital encounter of 10/18/12  CBC WITH DIFFERENTIAL      Component Value Range   WBC 6.6  4.0 - 10.5 K/uL   RBC 4.82  3.87 - 5.11 MIL/uL   Hemoglobin 15.8 (*) 12.0 - 15.0 g/dL   HCT 11.9  14.7 - 82.9 %   MCV 90.9  78.0 - 100.0 fL   MCH 32.8  26.0 - 34.0 pg   MCHC 36.1 (*) 30.0 - 36.0 g/dL   RDW 56.2  13.0 - 86.5 %   Platelets 57 (*) 150 - 400 K/uL   Neutrophils Relative 76  43 - 77 %   Neutro Abs 5.0  1.7 - 7.7 K/uL   Lymphocytes Relative 12  12 - 46 %   Lymphs Abs 0.8  0.7 - 4.0 K/uL   Monocytes Relative 12  3 - 12 %   Monocytes Absolute 0.8  0.1 - 1.0 K/uL   Eosinophils Relative 0  0 - 5 %   Eosinophils Absolute 0.0  0.0 - 0.7 K/uL   Basophils Relative 0  0 - 1 %   Basophils Absolute 0.0  0.0 - 0.1 K/uL  ETHANOL      Component Value Range   Alcohol, Ethyl (B) <11  0 - 11 mg/dL  COMPREHENSIVE METABOLIC PANEL      Component Value Range   Sodium 136  135 - 145 mEq/L   Potassium 2.3 (*) 3.5 - 5.1 mEq/L   Chloride 88 (*) 96 - 112 mEq/L   CO2 30  19 - 32 mEq/L   Glucose, Bld 125 (*) 70 - 99 mg/dL   BUN <3 (*) 6 - 23 mg/dL   Creatinine, Ser 7.84 (*) 0.50 - 1.10 mg/dL   Calcium 9.8  8.4 - 69.6 mg/dL   Total Protein 8.7 (*) 6.0 - 8.3 g/dL   Albumin 3.9  3.5 - 5.2 g/dL   AST 295 (*) 0 - 37 U/L   ALT 48 (*) 0 - 35 U/L   Alkaline Phosphatase 230 (*) 39 - 117 U/L   Total Bilirubin 1.1  0.3 - 1.2 mg/dL   GFR calc non Af Amer >  90  >90 mL/min   GFR calc Af Amer >90  >90 mL/min  LIPASE, BLOOD      Component Value Range   Lipase 34  11 - 59 U/L    1. Alcohol withdrawal  syndrome   2. Nausea & vomiting   3. Hypokalemia   4. 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 She does have some slight tremulousness which indicates early alcohol withdrawal syndrome. She'll be given IV hydration and IV antiemetics and will be started on Ativan for tremulousness. Old records are reviewed, and she has been diagnosed with cirrhosis secondary to alcohol abuse. I did not find any admissions for alcohol withdrawal syndrome.  She has been given IV fluids and IV antiemetics. Potassium is come back severely depressed at 2.3, and she is given intravenous and oral potassium. Given the degree of hypokalemia along with tremulousness indicating alcohol withdrawal, so she would needs to be admitted to. Case discussed with Dr. Janna Arch who is on-call for Dr. [name], who agrees to admit the patient  Katrina Booze, MD 10/18/12 1941

## 2012-10-18 NOTE — ED Notes (Signed)
Pt has no complaints at this time  

## 2012-10-18 NOTE — ED Notes (Signed)
CRITICAL VALUE ALERT  Critical value received:  Potassium 2.3  Date of notification:  10/18/2012  Time of notification:  18;31  Critical value read back: yes Nurse who received alert:  Juliette Alcide, RN   MD notified (1st page):  Dr. Preston Fleeting   Time of first page:  18:31  MD notified (2nd page):  Time of second page:  Responding MD:  Dr. Preston Fleeting   Time MD responded:  18;31

## 2012-10-18 NOTE — H&P (Signed)
[id number] 

## 2012-10-18 NOTE — ED Notes (Signed)
Dr. Janna Arch in with pt

## 2012-10-18 NOTE — ED Notes (Signed)
Lab called with critical potassium value of 2.3 Dr. Preston Fleeting notified,

## 2012-10-19 LAB — BASIC METABOLIC PANEL
BUN: 3 mg/dL — ABNORMAL LOW (ref 6–23)
CO2: 29 mEq/L (ref 19–32)
Chloride: 100 mEq/L (ref 96–112)
GFR calc non Af Amer: >90 mL/min (ref >90)
Glucose, Bld: 95 mg/dL (ref 70–99)
Potassium: 2.9 mEq/L — ABNORMAL LOW (ref 3.5–5.1)
Sodium: 137 mEq/L (ref 135–145)

## 2012-10-19 LAB — HEPATIC FUNCTION PANEL
Alkaline Phosphatase: 180 U/L — ABNORMAL HIGH (ref 39–117)
Indirect Bilirubin: 1 mg/dL — ABNORMAL HIGH (ref 0.3–0.9)
Total Protein: 6.7 g/dL (ref 6.0–8.3)

## 2012-10-19 LAB — TSH: TSH: 2.301 u[IU]/mL (ref 0.350–4.500)

## 2012-10-19 LAB — RAPID URINE DRUG SCREEN, HOSP PERFORMED: Opiates: NOT DETECTED

## 2012-10-19 LAB — PROTIME-INR
INR: 1.24 (ref 0.00–1.49)
Prothrombin Time: 15.4 seconds — ABNORMAL HIGH (ref 11.6–15.2)

## 2012-10-19 MED ORDER — MAGNESIUM OXIDE 400 (241.3 MG) MG PO TABS
400.0000 mg | ORAL_TABLET | Freq: Three times a day (TID) | ORAL | Status: DC
  Administered 2012-10-19 – 2012-10-22 (×10): 400 mg via ORAL
  Filled 2012-10-19 (×10): qty 1

## 2012-10-19 MED ORDER — POTASSIUM CHLORIDE 10 MEQ/100ML IV SOLN
10.0000 meq | INTRAVENOUS | Status: AC
  Administered 2012-10-19 (×4): 10 meq via INTRAVENOUS
  Filled 2012-10-19: qty 400

## 2012-10-19 MED ORDER — BIOTENE DRY MOUTH MT LIQD
15.0000 mL | Freq: Two times a day (BID) | OROMUCOSAL | Status: DC
  Administered 2012-10-19 – 2012-10-22 (×7): 15 mL via OROMUCOSAL

## 2012-10-19 NOTE — Progress Notes (Signed)
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  Other complications include hypokalemia and hypomagnesemia. Magnesium 1.2 today, potassium 2.9.  The patient has no evidence of DTs. At present, she is oriented in 3 spheres.  No asterixis.  No tremulousness.  OBJECTIVE:  VITAL SIGNS:  Blood pressure 121/75, temperature 97.9, pulse 97 and regular, respiratory rate is 20. LUNGS:  Prolonged expiratory phase.  Scattered rhonchi.  No rales, no wheeze. HEART:  Regular rhythm.  No murmurs, gallops, or rubs. ABDOMEN:  Soft, nontender.  Bowel sounds normoactive.  No guarding, rebound.  PLAN:  Right now is to administer magnesium oxide 400 a.c. t.i.d. for a period of 2 days, KCl 10 mEq x4 runs, monitor BMET in a.m., as well as magnesium levels.  Monitor for signs of ethanol withdrawal and make further recommendations in a.m.     Melvyn Novas, MD     RMD/MEDQ  D:  10/19/2012  T:  10/19/2012  Job:  [id number]

## 2012-10-19 NOTE — Progress Notes (Signed)
[id number] 

## 2012-10-19 NOTE — H&P (Signed)
NAME:  Katrina Roberts, Katrina Roberts NO.:  [id number]  MEDICAL RECORD NO.:  [id number]  LOCATION:  A320                          FACILITY:  [facility]  PHYSICIAN:  Melvyn Novas, MDDATE OF BIRTH:  [birth year]  DATE OF ADMISSION:  10/18/2012 DATE OF DISCHARGE:  LH                             HISTORY & PHYSICAL   HISTORY OF PRESENT ILLNESS:  The patient is a 46 year old white female, married, has a history of ethanol abuse and have been drinking 12 beers a day for 2 weeks, stopped yesterday.  She did have some vomiting.  She denies any hematemesis, melena, hematochezia.  She comes to the hospital and states that she wants to stop drinking.  She does have some mild epigastric discomfort and will be ruled out for gastritis/pancreatitis ethanol induced.  Lab work is pending at the time of this dictation. She has been states she has had tremulous and withdrawal issues possible DTs previously with alcohol withdrawal and will be placed on Ativan protocol with fluids.  She likewise has significant hypokalemia 2.3 and we will replete with potassium tonight as well as magnesium and multivitamins.  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  PAST SURGICAL HISTORY:  Remarkable only for EGD revealing reflux esophagitis.  No varices.  Colonoscopy essentially unremarkable.  She had a liver biopsy, which was inconclusive.  She has had no other surgical history.  SOCIAL HISTORY:  She does not work.  She lives with the husband and she does smoke 1 pack per day.  No other surreptitious.  Substance abuse at present none known.  ALLERGIES:  She has a allergy to PENICILLIN.  CURRENT MEDICINES:  Xanax 1 mg p.o. t.i.d., Prozac 20 mg p.o. daily, and ibuprofen 200 q.r.n. for pain.  PHYSICAL EXAMINATION:  VITAL SIGNS:  Blood  pressure 135/84, pulse 118 and regular, temperature 98.3, respiratory rate is 18, and O2 sat is 96%. HEENT:  Eyes, PERRLA.  Extraocular movements intact.  Sclerae clear. Conjunctivae pink. NECK:  No JVD.  No carotid bruits.  No thyromegaly.  No thyroid bruits. LUNGS:  Prolonged expiratory phase.  Diminished breath sounds at bases. No rales, wheeze, or rhonchi appreciable. HEART:  Regular rate and rhythm.  No murmurs, gallops, heaves, thrills, or rubs. ABDOMEN:  Soft, nontender.  Bowel sounds normoactive.  No guarding, rebound, mass, or megaly.  No fluid wave detectable. EXTREMITIES:  Trace to 1+ pedal edema. NEUROLOGIC:  No asterixis noted.  The patient's cranial nerves II through XII grossly intact.  Alert and oriented, cooperative.  No focal neurologic deficits noted.  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  PLAN:  Right now is to give IV fluids, IV potassium, KCl 10 mEq x5 runs. Monitor potassium, magnesium, lipase, liver function tests serially, and add Protonix 40 p.o. b.i.d.  Make further recommendations as the database expands along with Ativan for sedation and withdrawal.     Melvyn Novas, MD     RMD/MEDQ  D:  10/18/2012  T:  10/19/2012  Job:  [id number] 

## 2012-10-20 LAB — HEPATIC FUNCTION PANEL
ALT: 30 U/L (ref 0–35)
AST: 54 U/L — ABNORMAL HIGH (ref 0–37)
Albumin: 2.9 g/dL — ABNORMAL LOW (ref 3.5–5.2)
Alkaline Phosphatase: 171 U/L — ABNORMAL HIGH (ref 39–117)
Bilirubin, Direct: 0.4 mg/dL — ABNORMAL HIGH (ref 0.0–0.3)
Indirect Bilirubin: 0.8 mg/dL (ref 0.3–0.9)
Total Bilirubin: 1.2 mg/dL (ref 0.3–1.2)
Total Protein: 7.1 g/dL (ref 6.0–8.3)

## 2012-10-20 LAB — BASIC METABOLIC PANEL
BUN: 4 mg/dL — ABNORMAL LOW (ref 6–23)
Calcium: 8.3 mg/dL — ABNORMAL LOW (ref 8.4–10.5)
GFR calc non Af Amer: >90 mL/min (ref >90)
Glucose, Bld: 87 mg/dL (ref 70–99)
Sodium: 138 mEq/L (ref 135–145)

## 2012-10-20 LAB — PROTIME-INR
INR: 1.17 (ref 0.00–1.49)
Prothrombin Time: 14.7 seconds (ref 11.6–15.2)

## 2012-10-20 MED ORDER — POTASSIUM CHLORIDE CRYS ER 20 MEQ PO TBCR
20.0000 meq | EXTENDED_RELEASE_TABLET | Freq: Two times a day (BID) | ORAL | Status: DC
  Administered 2012-10-20 – 2012-10-22 (×5): 20 meq via ORAL
  Filled 2012-10-20 (×5): qty 1

## 2012-10-20 MED ORDER — ALPRAZOLAM 1 MG PO TABS
1.0000 mg | ORAL_TABLET | Freq: Every day | ORAL | Status: DC
  Administered 2012-10-20 – 2012-10-21 (×2): 1 mg via ORAL
  Filled 2012-10-20 (×2): qty 1

## 2012-10-20 NOTE — Progress Notes (Signed)
[id number] 

## 2012-10-21 ENCOUNTER — Inpatient Hospital Stay (HOSPITAL_COMMUNITY): Payer: Managed Care, Other (non HMO)

## 2012-10-21 LAB — BASIC METABOLIC PANEL
BUN: 7 mg/dL (ref 6–23)
Calcium: 8.5 mg/dL (ref 8.4–10.5)
Creatinine, Ser: 0.6 mg/dL (ref 0.50–1.10)
GFR calc Af Amer: >90 mL/min (ref >90)
GFR calc non Af Amer: >90 mL/min (ref >90)

## 2012-10-21 LAB — HEPATIC FUNCTION PANEL
Bilirubin, Direct: 0.3 mg/dL (ref 0.0–0.3)
Indirect Bilirubin: 0.5 mg/dL (ref 0.3–0.9)
Total Bilirubin: 0.8 mg/dL (ref 0.3–1.2)

## 2012-10-21 LAB — PROTIME-INR
INR: 1.15 (ref 0.00–1.49)
Prothrombin Time: 14.5 seconds (ref 11.6–15.2)

## 2012-10-21 MED ORDER — FLUOXETINE HCL 20 MG PO CAPS
20.0000 mg | ORAL_CAPSULE | Freq: Every day | ORAL | Status: DC
  Administered 2012-10-21 – 2012-10-22 (×2): 20 mg via ORAL
  Filled 2012-10-21 (×2): qty 1

## 2012-10-21 NOTE — Progress Notes (Signed)
NAME:  Katrina Roberts, Katrina Roberts NO.:  [id number]  MEDICAL RECORD NO.:  [id number]  LOCATION:  A320                          FACILITY:  [facility]  PHYSICIAN:  Mila Homer. [name], M.D.DATE OF BIRTH:  13-Sep-1966  DATE OF PROCEDURE: DATE OF DISCHARGE:                                PROGRESS NOTE   SUBJECTIVE:  The patient was admitted to the hospital several days ago with ethyl alcohol abuse and withdrawal symptomatology.  She feels a good deal better today.  She had started drinking again about 2 weeks ago after stressors at home including the death of her father-in-law and then the following day the death of her father-in-law's brother.  OBJECTIVE:  VITAL SIGNS:  Temperature 97.4, pulse 57, respiratory rate 18, blood pressure 115/68. General:  She looks somewhat depressed.  Her speech is normal. HEART:  Her heart has a regular rhythm, rate of 70. LUNGS:  Her lungs are clear throughout. ABDOMEN:  Her abdomen is soft without organomegaly or mass or tenderness and there is no ascites.  ASSESSMENT: 1. Alcohol withdrawal syndrome. 2. History of severe alcoholic hepatitis with ascites. 3. Depression. 4. Chronic ethyl alcoholism.  PLAN:  We had decreased her fluoxetine from 20 mg to 10 mg daily because she felt really she was doing well without it, but at this point we will increase back to 20 mg daily.  Hopefully she will be able to be discharged tomorrow.  Meanwhile continue on the lorazepam.     Mila Homer. [name], M.D.     SDK/MEDQ  D:  10/21/2012  T:  10/21/2012  Job:  [id number]

## 2012-10-21 NOTE — Care Management Note (Unsigned)
    Page 1 of 1   10/21/2012     1:22:49 PM   CARE MANAGEMENT NOTE 10/21/2012  Patient:  Katrina Roberts, Katrina Roberts   Account Number:  [id number]  Date Initiated:  10/21/2012  Documentation initiated by:  Sharrie Rothman  Subjective/Objective Assessment:   Pt admitted from home with gastritis and etoh abuse. Pt lives with her husband and will return home with him at discharge. Pt is independent with ADL's. Pt is wanting to stop drinking.     Action/Plan:   CSW met with pt and counseled in regards to etoh abuse. Pt has no CM or HH needs.   Anticipated DC Date:  10/23/2012   Anticipated DC Plan:  HOME/SELF CARE  In-house referral  Clinical Social Worker      DC Planning Services  CM consult      Choice offered to / List presented to:             Status of service:  Completed, signed off Medicare Important Message given?   (If response is "NO", the following Medicare IM given date fields will be blank) Date Medicare IM given:   Date Additional Medicare IM given:    Discharge Disposition:    Per UR Regulation:    If discussed at Long Length of Stay Meetings, dates discussed:    Comments:  10/21/12 1322 Arlyss Queen, RN BSN CM

## 2012-10-21 NOTE — Clinical Social Work Psychosocial (Signed)
Clinical Social Work Department BRIEF PSYCHOSOCIAL ASSESSMENT 10/21/2012  Patient:  Katrina Roberts, Katrina Roberts     Account Number:  [id number]     Admit date:  10/18/2012  Clinical Social Worker:  Santa Genera, CLINICAL SOCIAL WORKER  Date/Time:  10/21/2012 12:15 PM  Referred by:  CSW  Date Referred:  10/21/2012 Referred for  Substance Abuse   Other Referral:   Interview type:  Patient Other interview type:    PSYCHOSOCIAL DATA Living Status:  FAMILY Admitted from facility:   Level of care:   Primary support name:  Levora Murrah Primary support relationship to patient:  SPOUSE Degree of support available:   Significant    CURRENT CONCERNS Current Concerns  Substance Abuse   Other Concerns:   Chronic unemployment, unresolved grief    SOCIAL WORK ASSESSMENT / PLAN CSW met w patient at bedside, patient alert and oriented x4.  Discussed patient pattern of substance use, including recent relapse post 9 months of sobriety.  Patoient is unemployed, lives w husband who works at hotel in [place] and has no Public relations account executive.  Patient's mother lives nearby.  Patient seems somewhat isolated from outside social support other than husband and mother.  Patient states she has a history of depression and anxiety, and currently is on Xanax and Prozac, both of which she takes regularly.  She receives these medications from her PCP and does not see a therapist.    Since 2006, when patient lost job at [employer], patient has consumed 12 beers/day.  Prior to that she consumed 3 - 4 beers/day after work.  In Feb 2013, patient was hospitalized at [facility] for "fluid on stomach" and says she was diagnosed w cirrhosis of liver.  At that time, she was told she needed to quit drinking entirely for medical reasons.  Patient was able to stop, attended AA briefly but did not find AA helpful.  Patient has increased exercise, walking daily, and explored options for volunteer activity to fill her time.    Patient was  successful in abstinence until approx 2 weeks ago when there was a death in her husband's family and her husband and her dog were attacked at their apartment complex by another dog.  In addition, patient says "I always feel bad at this time of year."  Her father died when she was 80, was diagnosed w cancer in 11/26/2023 and died three months later.  Patient admits that this is a rather prolonged grief reaction, but says she feels sad and uncomfortable every holiday season.    Patient says she drinks to feel "numb" - she realizes that her problems are not helped by drinking, but that drinking beer allows her to forget stress and escape for a brief time.  She acknowledges that she needs to build alternative coping strategies, including exercise, not responding to the urge to drink immediately, and building her capacity to manage stressful feelings.  Patient wants to find a job and mentioned that she was helped by [facility] in past.    Patient will be given referral to Grief Share program, and encouraged to visit groups in order to help resolve grief over father's death.  Patient will be given names of therapists in area covered by her insurance.  Patient will also be given pamphlet Rethinking Drinking and encouraged to read.  In long run, patient says she wants to find a job and is considering making steps towards reconnecting w [employer].    Patient seemed appreciative and willing to consider building  coping strategies to reinforce abstinence. Patient's mother and husband are supportive and discourage her drinking, patient does not have alcohol in the house. Patient declined referral to AA as it has not been effective in past.   Assessment/plan status:  Psychosocial Support/Ongoing Assessment of Needs Other assessment/ plan:   Information/referral to community resources:   Rethinking Drinking    PATIENT'S/FAMILY'S RESPONSE TO PLAN OF CARE: Appreciative and willing to consider options for  maintaining sobriety.   Santa Genera, LCSW Clinical Social Worker [phone])

## 2012-10-21 NOTE — Progress Notes (Signed)
NAME:  SUMI, AX NO.:  [id number]  MEDICAL RECORD NO.:  [id number]  LOCATION:  A320                          FACILITY:  [facility]  PHYSICIAN:  Mila Homer. [name], M.D.DATE OF BIRTH:  1966/03/12  DATE OF PROCEDURE: DATE OF DISCHARGE:                                PROGRESS NOTE   ADDENDUM:  She told me today that she had started having her menstrual period 2 days ago, the 1st one she has had in 6 years since she had what was probably premature or early menopause at age 46.  She has not had a menstrual period in 6 years.  We discussed this briefly.  This will therefore be postmenopausal bleeding and I will have Gyn see her while she is in the hospital.     Mila Homer. [name], M.D.     SDK/MEDQ  D:  10/21/2012  T:  10/21/2012  Job:  [id number]

## 2012-10-21 NOTE — Progress Notes (Signed)
NAME:  Katrina Roberts, Katrina Roberts NO.:  [id number]  MEDICAL RECORD NO.:  [id number]  LOCATION:                                 FACILITY:  PHYSICIAN:  Melvyn Novas, MDDATE OF BIRTH:  1966-04-06  DATE OF PROCEDURE:  10/20/2012 DATE OF DISCHARGE:                                PROGRESS NOTE   SUBJECTIVE:  The patient came with alcohol hepatitis and gastritis with concomitant hypokalemia, etiology undetermined.  She had some hypomagnesemia also being repleted with magnesium oxide 400, a.c. t.i.d. for 2 days.  The patient on Ativan protocol.  Blood pressure 121/71, temperature 98.1, pulse 77 and regular, respiratory rate is 20. Potassium down to 3.3 today, alkaline phosphatase elevated at 171, AST elevated at 54, bilirubin 0.4.  Drug screen positive for benzodiazepines.  The patient was on Xanax by prescription prior to admission.  OBJECTIVE:  LUNGS:  Prolonged expiratory phase.  Scattered rhonchi.  No rales, no wheeze. HEART:  Regular rhythm.  No murmurs, gallops, or rubs. ABDOMEN:  Soft, nontender.  Bowel sounds normoactive.  No guarding, rebound, mass, or hepatosplenomegaly.  No evidence of DTs at present. NEUROLOGIC:  The patient alert and oriented.  PLAN:  Not so tremulous on Ativan.  We will decrease Ativan, monitor BMET in a.m., and his primary doctor, [name] will be back in a.m. Also, institute K-Dur 20 mEq p.o. b.i.d.     Melvyn Novas, MD     RMD/MEDQ  D:  10/20/2012  T:  10/21/2012  Job:  [id number]

## 2012-10-22 NOTE — Discharge Summary (Signed)
NAME:  Katrina Roberts, Katrina Roberts NO.:  [id number]  MEDICAL RECORD NO.:  [id number]  LOCATION:  A320                          FACILITY:  [facility]  PHYSICIAN:  Mila Homer. [name], M.D.DATE OF BIRTH:  [birth year]  DATE OF ADMISSION:  10/18/2012 DATE OF DISCHARGE:  11/26/2013LH                              DISCHARGE SUMMARY   A 46 year old was admitted to the hospital with gastritis and possibly pancreatitis felt to be secondary to ethyl alcohol ingestion.  She had a benign 5 day hospitalization extending from 11/22 to 10/22/2012.  Her vital signs are stable.  Her admission white cell count was 6600, hemoglobin 15.8.  Her admission potassium was 2.3, chloride 88, BUN less than 3, creatinine 0.47, glucose 125.  Her alk phos is 230, AST 110, ALT 48, ammonia level 21 and albumin 3.9 but then dropped to 2.8 with rehydration.  Her INR hovered around 1.  TSH was 2.3.  Alcohol level less than 11 on admission.  Urine toxicology test positive for benzodiazepines.  She had negative fecal occult blood.  She developed vaginal bleeding while she was in the hospital.  She has seen by Dr. Emelda Fear, gynecologist, who ordered a transvaginal and pelvic ultrasound which showed thickening of the endometrium but otherwise was normal.  She was treated with IV fluids of normal saline, kept on alprazolam, magnesium oxide 400 mg daily, pantoprazole 40 mg b.i.d., potassium chloride 20 mEq b.i.d., nicotine patch.  FINAL DISCHARGE DIAGNOSES: 1. Alcoholic gastritis. 2. Alcoholic hepatitis. 3. Chronic recurrent ethyl alcoholism. 4. Hypokalemia. 5. Dehydration. 6. Vaginal bleeding. 7. Anxiety. She is discharged home on alprazolam 1 mg, half to 1 tablet 3 times a day, fluoxetine 20 mg daily, ibuprofen 200 mg as needed for pain, nicotine patch if she desired.  She would get followup with me within the week, with Dr. Emelda Fear concerning the abnormal uterine ultrasound.  Arrangements also being made  for her to get counseling to help her with her chronic problems of ethyl alcohol.     Mila Homer. [name], M.D.     SDK/MEDQ  D:  10/22/2012  T:  10/22/2012  Job:  [id number]

## 2012-10-22 NOTE — Progress Notes (Signed)
Discharge instructions given to pt. With teach back given to RN. Pt. Ambulated to car with tech.

## 2012-10-22 NOTE — Clinical Social Work Note (Signed)
CSW followed up with pt and provided referral to [facility] for outpatient counseling. Information also on d/c instructions. Pt very appreciative.   Derenda Fennel, [place] [phone]

## 2012-10-29 ENCOUNTER — Other Ambulatory Visit: Payer: Self-pay

## 2012-10-29 ENCOUNTER — Other Ambulatory Visit: Payer: Self-pay | Admitting: Obstetrics and Gynecology

## 2012-10-29 DIAGNOSIS — K7460 Unspecified cirrhosis of liver: Secondary | ICD-10-CM

## 2012-12-26 ENCOUNTER — Telehealth: Payer: Self-pay | Admitting: Urgent Care

## 2012-12-26 NOTE — Telephone Encounter (Signed)
Please call to remind pt to have LFTs drawn per LSL. Thanks

## 2012-12-30 NOTE — Telephone Encounter (Signed)
Mailed letter to pt

## 2013-01-14 LAB — HEPATIC FUNCTION PANEL
ALT: 18 U/L (ref 0–35)
AST: 19 U/L (ref 0–37)
Albumin: 4 g/dL (ref 3.5–5.2)
Bilirubin, Direct: 0.1 mg/dL (ref 0.0–0.3)

## 2013-01-22 NOTE — Progress Notes (Signed)
Quick Note:  LFTs look good. Keep upcoming appt with RMR. ______

## 2013-02-10 ENCOUNTER — Other Ambulatory Visit: Payer: Self-pay

## 2013-02-25 ENCOUNTER — Encounter (HOSPITAL_COMMUNITY): Payer: Self-pay | Admitting: *Deleted

## 2013-02-25 ENCOUNTER — Inpatient Hospital Stay (HOSPITAL_COMMUNITY)
Admission: EM | Admit: 2013-02-25 | Discharge: 2013-02-28 | DRG: 897 | Disposition: A | Discharge status: Home / Self Care | Payer: Managed Care, Other (non HMO) | Attending: Family Medicine | Admitting: Family Medicine

## 2013-02-25 DIAGNOSIS — Z79899 Other long term (current) drug therapy: Secondary | ICD-10-CM

## 2013-02-25 DIAGNOSIS — F1020 Alcohol dependence, uncomplicated: Secondary | ICD-10-CM | POA: Diagnosis present

## 2013-02-25 DIAGNOSIS — Z880 Allergy status to penicillin: Secondary | ICD-10-CM

## 2013-02-25 DIAGNOSIS — F17200 Nicotine dependence, unspecified, uncomplicated: Secondary | ICD-10-CM | POA: Diagnosis present

## 2013-02-25 DIAGNOSIS — K7030 Alcoholic cirrhosis of liver without ascites: Secondary | ICD-10-CM | POA: Diagnosis present

## 2013-02-25 DIAGNOSIS — D6959 Other secondary thrombocytopenia: Secondary | ICD-10-CM | POA: Diagnosis present

## 2013-02-25 DIAGNOSIS — D649 Anemia, unspecified: Secondary | ICD-10-CM | POA: Diagnosis present

## 2013-02-25 DIAGNOSIS — Z803 Family history of malignant neoplasm of breast: Secondary | ICD-10-CM

## 2013-02-25 DIAGNOSIS — Z809 Family history of malignant neoplasm, unspecified: Secondary | ICD-10-CM

## 2013-02-25 DIAGNOSIS — F10939 Alcohol use, unspecified with withdrawal, unspecified: Principal | ICD-10-CM | POA: Diagnosis present

## 2013-02-25 DIAGNOSIS — Z801 Family history of malignant neoplasm of trachea, bronchus and lung: Secondary | ICD-10-CM

## 2013-02-25 DIAGNOSIS — F329 Major depressive disorder, single episode, unspecified: Secondary | ICD-10-CM | POA: Diagnosis present

## 2013-02-25 DIAGNOSIS — F3289 Other specified depressive episodes: Secondary | ICD-10-CM | POA: Diagnosis present

## 2013-02-25 DIAGNOSIS — F10239 Alcohol dependence with withdrawal, unspecified: Principal | ICD-10-CM | POA: Diagnosis present

## 2013-02-25 DIAGNOSIS — K219 Gastro-esophageal reflux disease without esophagitis: Secondary | ICD-10-CM | POA: Diagnosis present

## 2013-02-25 DIAGNOSIS — F1010 Alcohol abuse, uncomplicated: Secondary | ICD-10-CM

## 2013-02-25 DIAGNOSIS — F411 Generalized anxiety disorder: Secondary | ICD-10-CM | POA: Diagnosis present

## 2013-02-25 DIAGNOSIS — E876 Hypokalemia: Secondary | ICD-10-CM | POA: Diagnosis present

## 2013-02-25 DIAGNOSIS — K7010 Alcoholic hepatitis without ascites: Secondary | ICD-10-CM | POA: Diagnosis present

## 2013-02-25 LAB — CBC WITH DIFFERENTIAL/PLATELET
Basophils Absolute: 0 10*3/uL (ref 0.0–0.1)
Lymphs Abs: 2.4 10*3/uL (ref 0.7–4.0)
MCV: 87.6 fL (ref 78.0–100.0)
Monocytes Relative: 9 % (ref 3–12)
Platelets: 86 10*3/uL — ABNORMAL LOW (ref 150–400)
RDW: 13.4 % (ref 11.5–15.5)
WBC: 7.3 10*3/uL (ref 4.0–10.5)

## 2013-02-25 LAB — COMPREHENSIVE METABOLIC PANEL
Albumin: 3.8 g/dL (ref 3.5–5.2)
BUN: 6 mg/dL (ref 6–23)
Calcium: 8.8 mg/dL (ref 8.4–10.5)
Creatinine, Ser: 0.55 mg/dL (ref 0.50–1.10)
Potassium: 3 mEq/L — ABNORMAL LOW (ref 3.5–5.1)
Total Protein: 8.5 g/dL — ABNORMAL HIGH (ref 6.0–8.3)

## 2013-02-25 LAB — RAPID URINE DRUG SCREEN, HOSP PERFORMED
Amphetamines: NOT DETECTED
Benzodiazepines: NOT DETECTED
Cocaine: NOT DETECTED
Opiates: NOT DETECTED

## 2013-02-25 LAB — ETHANOL: Alcohol, Ethyl (B): 320 mg/dL — ABNORMAL HIGH (ref 0–11)

## 2013-02-25 MED ORDER — FLUOXETINE HCL 20 MG PO CAPS
20.0000 mg | ORAL_CAPSULE | Freq: Every day | ORAL | Status: DC
  Administered 2013-02-26 – 2013-02-28 (×3): 20 mg via ORAL
  Filled 2013-02-25 (×5): qty 1

## 2013-02-25 MED ORDER — LORAZEPAM 1 MG PO TABS: 0.0000 mg | ORAL_TABLET | Freq: Two times a day (BID) | ORAL | Status: DC

## 2013-02-25 MED ORDER — ZOLPIDEM TARTRATE 5 MG PO TABS
5.0000 mg | ORAL_TABLET | Freq: Every evening | ORAL | Status: DC | PRN
  Administered 2013-02-25 – 2013-02-27 (×3): 5 mg via ORAL
  Filled 2013-02-25 (×3): qty 1

## 2013-02-25 MED ORDER — FOLIC ACID 1 MG PO TABS
1.0000 mg | ORAL_TABLET | Freq: Every day | ORAL | Status: DC
  Administered 2013-02-25 – 2013-02-28 (×4): 1 mg via ORAL
  Filled 2013-02-25 (×4): qty 1

## 2013-02-25 MED ORDER — ONDANSETRON HCL 4 MG PO TABS
4.0000 mg | ORAL_TABLET | Freq: Three times a day (TID) | ORAL | Status: DC | PRN
  Administered 2013-02-26: 4 mg via ORAL
  Filled 2013-02-25: qty 1

## 2013-02-25 MED ORDER — IBUPROFEN 800 MG PO TABS: 400.0000 mg | ORAL_TABLET | Freq: Three times a day (TID) | ORAL | Status: DC | PRN

## 2013-02-25 MED ORDER — NORETHINDRONE ACETATE 5 MG PO TABS
2.5000 mg | ORAL_TABLET | Freq: Every day | ORAL | Status: DC
  Administered 2013-02-26 – 2013-02-28 (×3): 2.5 mg via ORAL
  Filled 2013-02-25 (×7): qty 1

## 2013-02-25 MED ORDER — ALPRAZOLAM 0.5 MG PO TABS: 0.5000 mg | ORAL_TABLET | Freq: Three times a day (TID) | ORAL | Status: DC | PRN

## 2013-02-25 MED ORDER — ACETAMINOPHEN 500 MG PO TABS: 500.0000 mg | ORAL_TABLET | ORAL | Status: DC | PRN

## 2013-02-25 MED ORDER — PANTOPRAZOLE SODIUM 40 MG IV SOLR
40.0000 mg | INTRAVENOUS | Status: DC
  Administered 2013-02-25 – 2013-02-26 (×2): 40 mg via INTRAVENOUS
  Filled 2013-02-25 (×2): qty 40

## 2013-02-25 MED ORDER — SODIUM CHLORIDE 0.9 % IV SOLN
INTRAVENOUS | Status: DC
  Administered 2013-02-25 – 2013-02-27 (×4): via INTRAVENOUS

## 2013-02-25 MED ORDER — ADULT MULTIVITAMIN W/MINERALS CH
1.0000 | ORAL_TABLET | Freq: Every day | ORAL | Status: DC
  Administered 2013-02-25 – 2013-02-28 (×4): 1 via ORAL
  Filled 2013-02-25 (×4): qty 1

## 2013-02-25 MED ORDER — VITAMIN B-1 100 MG PO TABS
100.0000 mg | ORAL_TABLET | Freq: Every day | ORAL | Status: DC
  Administered 2013-02-25 – 2013-02-28 (×4): 100 mg via ORAL
  Filled 2013-02-25 (×4): qty 1

## 2013-02-25 MED ORDER — ENOXAPARIN SODIUM 40 MG/0.4ML ~~LOC~~ SOLN
40.0000 mg | SUBCUTANEOUS | Status: DC
  Administered 2013-02-25 – 2013-02-27 (×3): 40 mg via SUBCUTANEOUS
  Filled 2013-02-25 (×3): qty 0.4

## 2013-02-25 MED ORDER — LORAZEPAM 1 MG PO TABS
1.0000 mg | ORAL_TABLET | Freq: Four times a day (QID) | ORAL | Status: DC | PRN
  Administered 2013-02-26 – 2013-02-27 (×3): 1 mg via ORAL
  Filled 2013-02-25 (×3): qty 1
  Filled 2013-02-25: qty 2

## 2013-02-25 MED ORDER — THIAMINE HCL 100 MG/ML IJ SOLN: 100.0000 mg | Freq: Every day | INTRAMUSCULAR | Status: DC

## 2013-02-25 MED ORDER — NICOTINE 21 MG/24HR TD PT24
21.0000 mg | MEDICATED_PATCH | Freq: Every day | TRANSDERMAL | Status: DC | PRN
  Administered 2013-02-26: 21 mg via TRANSDERMAL
  Filled 2013-02-25: qty 1

## 2013-02-25 MED ORDER — LORAZEPAM 1 MG PO TABS
1.0000 mg | ORAL_TABLET | Freq: Once | ORAL | Status: AC
  Administered 2013-02-25: 1 mg via ORAL
  Filled 2013-02-25: qty 1

## 2013-02-25 MED ORDER — POTASSIUM CHLORIDE 20 MEQ/15ML (10%) PO LIQD
40.0000 meq | Freq: Once | ORAL | Status: AC
  Administered 2013-02-25: 40 meq via ORAL
  Filled 2013-02-25: qty 30

## 2013-02-25 MED ORDER — ALUM & MAG HYDROXIDE-SIMETH 200-200-20 MG/5ML PO SUSP: 30.0000 mL | ORAL | Status: DC | PRN

## 2013-02-25 MED ORDER — LORAZEPAM 2 MG/ML IJ SOLN: 1.0000 mg | Freq: Four times a day (QID) | INTRAMUSCULAR | Status: DC | PRN

## 2013-02-25 MED ORDER — LORAZEPAM 1 MG PO TABS
0.0000 mg | ORAL_TABLET | Freq: Four times a day (QID) | ORAL | Status: AC
  Administered 2013-02-26 (×3): 2 mg via ORAL
  Administered 2013-02-27: 1 mg via ORAL
  Filled 2013-02-25 (×2): qty 2
  Filled 2013-02-25: qty 4

## 2013-02-25 NOTE — ED Notes (Addendum)
Pt presents to er with request for help with detox from alcohol , pt states that she started drinking again two weeks ago, had previously stopped drinking for 4 months ago, pt states that she started drinking again due to some "problems" that she has. Denies any SI/HI. Admits to drinking 10 beers today,

## 2013-02-25 NOTE — ED Notes (Signed)
Pt states she is here for ETOH detox. Pt states she had been sober for 8 months until 2 weeks ago when she had a relapse. Pt states she called her MD today and he told her to come here for help.

## 2013-02-25 NOTE — ED Provider Notes (Signed)
History     CSN: [id number]  Arrival date & time 02/25/13  1727   First MD Initiated Contact with Patient 02/25/13 1829      Chief Complaint  Patient presents with  . V70.1    HPI Pt was seen at 1920.   Per pt, c/o gradual onset and persistence of constant daily etoh abuse for the past 2 weeks. Pt states she has a hx of etoh abuse, had been sober for the past 8 months until she relapsed 2 weeks ago.  Pt states she started to have "problems" that caused her to start drinking again; and refuses to elaborate.  LD etoh today.  Denies SI, no SA, no HI, no hallucinations.     Past Medical History  Diagnosis Date  . Anxiety   . Depression   . Smoker   . GERD (gastroesophageal reflux disease)   . Headache   . Alcoholic hepatitis   . Liver disease     etoh and +/-autoimmune (h/o elevated immunoglobulins and mildly elevated antismooth muscle ab  . Anemia   . Cirrhosis 02/2012    MRI, afp 04/19/12= 5.3, U/S 02/29/12= no liver tumors, immune to Hep B.   . Elevated AFP 02/2012    12  . Asymptomatic cholelithiasis   . Anemia   . Hiatal hernia     small    Past Surgical History  Procedure Laterality Date  . Esophagogastroduodenoscopy  01/25/2012    Reflux esophagitis, no varices, ?short segment Barrett's, antral erosions. Bx showed reactive gastropathy. No H.Pylor or barrett's.  Procedure: ESOPHAGOGASTRODUODENOSCOPY (EGD);  Surgeon: Corbin Ade, MD;  Location: [facility];  Service: Endoscopy;  Laterality: N/A;    . Colonoscopy  06/2010    Dr. Mathis Bud edematous colon diffusely with diffuse friability, segmental biopsies  unremarkable  . Liver biopsy  04/16/12    poor sample, some fibrosis and no significant iron, inconclusive as far as bile ducts/autoimmune component is concerned    Family History  Problem Relation Age of Onset  . Breast cancer Mother   . Lung cancer Father   . Cancer Paternal Aunt     unknown primary  . Colon cancer Neg Hx   . Liver disease Neg Hx      History  Substance Use Topics  . Smoking status: Current Every Day Smoker -- 0.50 packs/day    Types: Cigarettes  . Smokeless tobacco: Not on file  . Alcohol Use: 50.4 oz/week    84 Cans of beer per week    Review of Systems ROS: Statement: All systems negative except as marked or noted in the HPI; Constitutional: Negative for fever and chills. ; ; Eyes: Negative for eye pain, redness and discharge. ; ; ENMT: Negative for ear pain, hoarseness, nasal congestion, sinus pressure and sore throat. ; ; Cardiovascular: Negative for chest pain, palpitations, diaphoresis, dyspnea and peripheral edema. ; ; Respiratory: Negative for cough, wheezing and stridor. ; ; Gastrointestinal: Negative for nausea, vomiting, diarrhea, abdominal pain, blood in stool, hematemesis, jaundice and rectal bleeding. . ; ; Genitourinary: Negative for dysuria, flank pain and hematuria. ; ; Musculoskeletal: Negative for back pain and neck pain. Negative for swelling and trauma.; ; Skin: Negative for pruritus, rash, abrasions, blisters, bruising and skin lesion.; ; Neuro: Negative for headache, lightheadedness and neck stiffness. Negative for weakness, altered level of consciousness , altered mental status, extremity weakness, paresthesias, involuntary movement, seizure and syncope.; Psych:  No SI, no SA, no HI, no hallucinations.  Allergies  Penicillins  Home Medications   Current Outpatient Rx  Name  Route  Sig  Dispense  Refill  . ALPRAZolam (XANAX) 1 MG tablet   Oral   Take 0.5-1 mg by mouth 3 (three) times daily as needed. Takes one-half tablet in the morning and at lunch, then take one tablet at bedtime as needed For anxiety         . FLUoxetine (PROZAC) 20 MG tablet   Oral   Take 20 mg by mouth daily.          Marland Kitchen ibuprofen (ADVIL,MOTRIN) 200 MG tablet   Oral   Take 200 mg by mouth daily as needed. For pain         . norethindrone (AYGESTIN) 5 MG tablet   Oral   Take 2.5 mg by mouth daily.            BP 148/94  Pulse 131  Temp(Src) 99.2 F (37.3 C) (Oral)  Resp 20  SpO2 95%  Physical Exam 1925: Physical examination:  Nursing notes reviewed; Vital signs and O2 SAT reviewed;  Constitutional: Well developed, Well nourished, Well hydrated, In no acute distress; Head:  Normocephalic, atraumatic; Eyes: EOMI, PERRL, No scleral icterus; ENMT: Mouth and pharynx normal, Mucous membranes moist; Neck: Supple, Full range of motion, No lymphadenopathy; Cardiovascular: Regular rate and rhythm, No murmur, rub, or gallop; Respiratory: Breath sounds clear & equal bilaterally, No rales, rhonchi, wheezes.  Speaking full sentences with ease, Normal respiratory effort/excursion; Chest: Nontender, Movement normal;; Extremities: Pulses normal, No tenderness, No edema, No calf edema or asymmetry.; Neuro: AA&Ox3, Major CN grossly intact.  Speech clear. Climbs on and off stretcher by herself easily. Gait steady. No gross focal motor or sensory deficits in extremities.; Skin: Color normal, Warm, Dry.; Psych:  Affect flat, poor eye contact.    ED Course  Procedures      MDM  MDM Reviewed: previous chart, nursing note and vitals Reviewed previous: labs Interpretation: labs   Results for orders placed during the hospital encounter of 02/25/13  CBC WITH DIFFERENTIAL      Result Value Range   WBC 7.3  4.0 - 10.5 K/uL   RBC 4.85  3.87 - 5.11 MIL/uL   Hemoglobin 15.1 (*) 12.0 - 15.0 g/dL   HCT 29.5  62.1 - 30.8 %   MCV 87.6  78.0 - 100.0 fL   MCH 31.1  26.0 - 34.0 pg   MCHC 35.5  30.0 - 36.0 g/dL   RDW 65.7  84.6 - 96.2 %   Platelets 86 (*) 150 - 400 K/uL   Neutrophils Relative 57  43 - 77 %   Lymphocytes Relative 33  12 - 46 %   Monocytes Relative 9  3 - 12 %   Eosinophils Relative 1  0 - 5 %   Basophils Relative 0  0 - 1 %   Neutro Abs 4.1  1.7 - 7.7 K/uL   Lymphs Abs 2.4  0.7 - 4.0 K/uL   Monocytes Absolute 0.7  0.1 - 1.0 K/uL   Eosinophils Absolute 0.1  0.0 - 0.7 K/uL   Basophils  Absolute 0.0  0.0 - 0.1 K/uL   WBC Morphology ATYPICAL LYMPHOCYTES     Smear Review LARGE PLATELETS PRESENT    COMPREHENSIVE METABOLIC PANEL      Result Value Range   Sodium 139  135 - 145 mEq/L   Potassium 3.0 (*) 3.5 - 5.1 mEq/L   Chloride 98  96 - 112  mEq/L   CO2 24  19 - 32 mEq/L   Glucose, Bld 113 (*) 70 - 99 mg/dL   BUN 6  6 - 23 mg/dL   Creatinine, Ser 4.09  0.50 - 1.10 mg/dL   Calcium 8.8  8.4 - 81.1 mg/dL   Total Protein 8.5 (*) 6.0 - 8.3 g/dL   Albumin 3.8  3.5 - 5.2 g/dL   AST 72 (*) 0 - 37 U/L   ALT 36 (*) 0 - 35 U/L   Alkaline Phosphatase 140 (*) 39 - 117 U/L   Total Bilirubin 0.4  0.3 - 1.2 mg/dL   GFR calc non Af Amer >90  >90 mL/min   GFR calc Af Amer >90  >90 mL/min  ETHANOL      Result Value Range   Alcohol, Ethyl (B) 320 (*) 0 - 11 mg/dL  URINE RAPID DRUG SCREEN (HOSP PERFORMED)      Result Value Range   Opiates NONE DETECTED  NONE DETECTED   Cocaine NONE DETECTED  NONE DETECTED   Benzodiazepines NONE DETECTED  NONE DETECTED   Amphetamines NONE DETECTED  NONE DETECTED   Tetrahydrocannabinol NONE DETECTED  NONE DETECTED   Barbiturates NONE DETECTED  NONE DETECTED  AMMONIA      Result Value Range   Ammonia 25  11 - 60 umol/L  PROTIME-INR      Result Value Range   Prothrombin Time 13.2  11.6 - 15.2 seconds   INR 1.01  0.00 - 1.49    Results for COLETA, GROSSHANS (MRN [id number]) as of 02/25/2013 21:03  Ref. Range 10/19/2012 05:07 10/20/2012 05:12 10/21/2012 05:34 01/14/2013 15:05 02/25/2013 18:40  AST Latest Range: 0-37 U/L 70 (H) 54 (H) 41 (H) 19 72 (H)  ALT Latest Range: 0-35 U/L 33 30 27 18  36 (H)    2100:  Known LFT elevation.  ACT Samson Frederic states no beds available this evening.  Will hold in ED.  Holding orders and CIWA protocol written.    2300:  Dr. [name] knows pt well and he has been into the ED to see her (she called his office today).  States he will admit her to the hospital.       Laray Anger, DO 02/27/13 2059

## 2013-02-26 ENCOUNTER — Encounter (HOSPITAL_COMMUNITY): Payer: Self-pay | Admitting: *Deleted

## 2013-02-26 LAB — BASIC METABOLIC PANEL
BUN: 6 mg/dL (ref 6–23)
Calcium: 8.2 mg/dL — ABNORMAL LOW (ref 8.4–10.5)
Creatinine, Ser: 0.51 mg/dL (ref 0.50–1.10)
GFR calc Af Amer: >90 mL/min (ref >90)
GFR calc non Af Amer: >90 mL/min (ref >90)
Glucose, Bld: 78 mg/dL (ref 70–99)

## 2013-02-26 LAB — CBC
HCT: 37.7 % (ref 36.0–46.0)
Hemoglobin: 13.2 g/dL (ref 12.0–15.0)
MCH: 30.8 pg (ref 26.0–34.0)
MCHC: 35 g/dL (ref 30.0–36.0)
MCV: 88.1 fL (ref 78.0–100.0)
RDW: 13.6 % (ref 11.5–15.5)

## 2013-02-26 LAB — MRSA PCR SCREENING: MRSA by PCR: NEGATIVE

## 2013-02-26 MED ORDER — POTASSIUM CHLORIDE CRYS ER 20 MEQ PO TBCR
20.0000 meq | EXTENDED_RELEASE_TABLET | Freq: Two times a day (BID) | ORAL | Status: DC
  Administered 2013-02-26 – 2013-02-28 (×5): 20 meq via ORAL
  Filled 2013-02-26 (×5): qty 1

## 2013-02-26 MED ORDER — POTASSIUM CHLORIDE 20 MEQ/15ML (10%) PO LIQD
40.0000 meq | Freq: Once | ORAL | Status: AC
  Administered 2013-02-26: 40 meq via ORAL
  Filled 2013-02-26: qty 30

## 2013-02-26 NOTE — H&P (Signed)
NAMEWYNELL, Katrina Roberts NO.:  [id number]  MEDICAL RECORD NO.:  [id number]  LOCATION:  A317                          FACILITY:  [facility]  PHYSICIAN:  Mila Homer. [name], M.D.DATE OF BIRTH:  July 03, 1966  DATE OF ADMISSION:  02/25/2013 DATE OF DISCHARGE:  LH                             HISTORY & PHYSICAL   HISTORY OF PRESENT ILLNESS:  This 47 year old started drinking alcohol again about 2 weeks ago.  Apparently she had run out of fluoxetine due to some problems involving insurance and pharmacies, and out of this for 2 days.  She decided to go to the [facility] store in [place] and purchase a fifth of vodka, which she did start again drinking, and which she has been doing for about 2 weeks.  She has had several other bouts with alcoholism, 1 which resulted in severe alcoholic hepatitis, cirrhosis, and abdominal ascites.  Her last bout was about 3 months ago, and the 1 before that about 9 months previously.  Home meds have including fluoxetine 20 mg daily; norethindrone 2.5 mg daily; alprazolam 1 mg, half to 1 tablet up to 3 times a day; and ibuprofen for pain.  She called me yesterday telling me about all this.  I recommended she come to the hospital immediately for admission, which was about 1 p.m., and she did arrive for evaluation 4 or 5 hours later.  In addition to the history of alcoholic hepatitis and cirrhosis, she has had asymptomatic cholelithiasis, anemia, hiatal hernia, reflux esophagitis, and elevated AFP.  ADMISSION EXAM:  GENERAL:  Showed a 52-ish-year-old woman who looked her stated age.  She was somewhat tremulous at the time I saw her, some hours after admission.  She appeared to be oriented and alert, however. Her speech was normal.  Her affect was good. HEART:  Her heart had a regular rhythm.  Rate of about 110. LUNGS:  Her lungs were clear throughout. ABDOMEN:  Her abdomen was tender throughout, mainly in the lower abdomen. EXTREMITIES:  There  was no edema of the ankles.  DIAGNOSTIC STUDIES:  Her admission white cell count was 7300, of which 57% were neutrophils.  Her platelet count was 86,000.  Her admission potassium was 3.0, glucose 113, alk phos 140, AST 72, ALT 36, total protein 8.5.  Her urine tox screen was negative.  MRSA by PCR was negative.  INR was 1.01.  Alcohol level 320.  Potassium dropped to 2.7 some hours after admission.  ADMISSION DIAGNOSES: 1. Alcohol withdrawal syndrome. 2. Acute alcoholic hepatitis. 3. Thrombocytopenia, probably alcohol induced. 4. Hypokalemia. 5. Chronic depression. 6. Chronic intermittent ethyl alcoholism.  PLAN:  We will continue her on the same meds, but she will be on the alcohol withdrawal protocol.  Potassium is being replenished.  We will be following her CBC and BMP.     Mila Homer. [name], M.D.     SDK/MEDQ  D:  02/26/2013  T:  02/26/2013  Job:  [id number]

## 2013-02-26 NOTE — ED Notes (Signed)
Patient complaining of "feeling jittery."

## 2013-02-26 NOTE — Care Management Note (Signed)
    Page 1 of 1   02/28/2013     11:47:06 AM   CARE MANAGEMENT NOTE 02/28/2013  Patient:  Katrina Roberts, Katrina Roberts   Account Number:  [id number]  Date Initiated:  02/26/2013  Documentation initiated by:  Sharrie Rothman  Subjective/Objective Assessment:   Pt admitted from home wanting alcohol detox. Pt lives with her husband and will return home at discharge. Pt is independent with ADL's.     Action/Plan:   Pt may benefit from CSW referral. Will have MD order.   Anticipated DC Date:  02/28/2013   Anticipated DC Plan:  HOME/SELF CARE      DC Planning Services  CM consult      Choice offered to / List presented to:             Status of service:  Completed, signed off Medicare Important Message given?   (If response is "NO", the following Medicare IM given date fields will be blank) Date Medicare IM given:   Date Additional Medicare IM given:    Discharge Disposition:  HOME/SELF CARE  Per UR Regulation:    If discussed at Long Length of Stay Meetings, dates discussed:    Comments:  02/28/13 1145 Arlyss Queen, RN BSN CM Pt discharged home today. No CM needs noted.  02/26/13 1500 Arlyss Queen, RN BSN CM

## 2013-02-26 NOTE — Progress Notes (Signed)
UR Chart Review Completed  

## 2013-02-26 NOTE — Progress Notes (Signed)
Critical K 2.7 called; Dr [name] notified; new orders for potassium given

## 2013-02-27 LAB — CBC
HCT: 38.7 % (ref 36.0–46.0)
Hemoglobin: 13.2 g/dL (ref 12.0–15.0)
RDW: 13.5 % (ref 11.5–15.5)
WBC: 4.5 10*3/uL (ref 4.0–10.5)

## 2013-02-27 LAB — BASIC METABOLIC PANEL
BUN: 3 mg/dL — ABNORMAL LOW (ref 6–23)
Chloride: 103 mEq/L (ref 96–112)
GFR calc Af Amer: >90 mL/min (ref >90)
Potassium: 3.8 mEq/L (ref 3.5–5.1)
Sodium: 135 mEq/L (ref 135–145)

## 2013-02-27 MED ORDER — PANTOPRAZOLE SODIUM 40 MG PO TBEC
40.0000 mg | DELAYED_RELEASE_TABLET | Freq: Every day | ORAL | Status: DC
  Administered 2013-02-27: 40 mg via ORAL
  Filled 2013-02-27: qty 1

## 2013-02-27 NOTE — Progress Notes (Signed)
The patient is receiving Protonix by the intravenous route.  Based on criteria approved by the Pharmacy and Therapeutics Committee and the Medical Executive Committee, the medication is being converted to the equivalent oral dose form.  These criteria include: -No Active GI bleeding -Able to tolerate diet of full liquids (or better) or tube feeding OR able to tolerate other medications by the oral or enteral route  If you have any questions about this conversion, please contact the Pharmacy Department (ext [phone]).  Thank you.  Wayland Denis, [facility] 02/27/2013 2:14 PM

## 2013-02-28 LAB — BASIC METABOLIC PANEL
CO2: 24 mEq/L (ref 19–32)
Chloride: 105 mEq/L (ref 96–112)
Creatinine, Ser: 0.63 mg/dL (ref 0.50–1.10)
Potassium: 4.2 mEq/L (ref 3.5–5.1)

## 2013-02-28 MED ORDER — CIPROFLOXACIN HCL 500 MG PO TABS: 500.0000 mg | ORAL_TABLET | Freq: Two times a day (BID) | ORAL | Status: DC

## 2013-02-28 MED ORDER — THERA VITAL M PO TABS: 2.0000 | ORAL_TABLET | Freq: Every day | ORAL | Status: DC

## 2013-02-28 NOTE — Progress Notes (Signed)
Katrina Roberts, Katrina Roberts NO.:  [id number]  MEDICAL RECORD NO.:  [id number]  LOCATION:  A317                          FACILITY:  [facility]  PHYSICIAN:  Mila Homer. [name], M.D.DATE OF BIRTH:  11-12-1966  DATE OF PROCEDURE: DATE OF DISCHARGE:                                PROGRESS NOTE   SUBJECTIVE:  She is feeling somewhat better, but still does not have much of an appetite.  She has been coughing ever since she has been in the hospital.  The cough is somewhat productive of thick yellowish to brownish sputum.  OBJECTIVE:  VITAL SIGNS:  Temperature is 98.3, pulse 117, respiratory rate 20, blood pressure 136/94.  While I was seeing her, she was coughing and went to sit up in the process.  Her heart rate went up above 120, but then came back down to 120s. Lungs:  Clear throughout. HEART:  Was tachycardic as above.  The potassium today is 3.8, BUN down to 3.  Otherwise the BMP and CBC were essentially normal.  The platelet count is still low at 51,100. This is probably a direct toxic effect on the bone marrow.  ASSESSMENT: 1. Alcohol withdrawal syndrome. 2. Acute alcoholic hepatitis. 3. Thrombocytopenia, which is alcohol related and a direct toxic     effect of alcohol on the bone marrow. 4. Hypokalemia which is cleared. 5. Chronic depression. 6. Chronic intermittent ethyl alcoholism.  PLAN: 1. She will be up here at least 1 more day, recuperating.  We will     keep her on IV fluids, decrease her rate.  She is going to attempt     to eat a little bit more. 2. We have discussed her alcoholism at length.  She used to go to AA     at one time, but did not find it that     beneficial. 3. She understands this is in a certain sense a genetic disease, but     she will need to take responsibility for it.  She has a buddy to     call when she starts to feel cravings for alcohol.     Mila Homer. [name], M.D.     SDK/MEDQ  D:  02/27/2013  T:  02/28/2013  Job:   [id number]

## 2013-02-28 NOTE — Progress Notes (Signed)
Patient discharged home with instructions given on medications,and follow up visits,verbalized understanding.Prescriptions sent with patient.No c/o pain or discomfort noted. Vital signs stable. Accompanied  By staff to an awaiting vehicle.

## 2013-02-28 NOTE — Discharge Summary (Signed)
NAMESERITA, DEGROOTE NO.:  [id number]  MEDICAL RECORD NO.:  [id number]  LOCATION:  A317                          FACILITY:  [facility]  PHYSICIAN:  Mila Homer. [name], M.D.DATE OF BIRTH:  09-05-1966  DATE OF ADMISSION:  02/25/2013 DATE OF DISCHARGE:  04/04/2014LH                              DISCHARGE SUMMARY   This 47 year old was admitted to the hospital with a diagnosis of acute alcohol withdrawal syndrome.  She was hospitalized for 4 days, from April 1 to February 28, 2013.  On admission she had a sodium 139, potassium 3.0, glucose 113, alk phos 140, AST 72, ALT 36.  Her alcohol level was 320.  Urine toxic screen was negative.  MRSA by PCR was negative.  Blood tests were followed, and potassium level was found to drop to 2.7, then recheck was 3.8 and 4.2, and potassium replacement.  She was admitted to the hospital and put on the alcohol-withdrawal protocol.  She received Lorazepam when she began to get the shakes.  She was given enoxaparin 40 mg subcu daily and put on fluoxetine 20 mg daily, folic acid 1 mg daily, Lorazepam as directed, up to 4 mg q.12h., multivitamins with minerals daily, norethindrone 2.5 mg daily, pantoprazole 40 mg daily,  KCL eventually 20 mg b.i.d., thiamine 100 mg IV daily and then thiamine 100 mg p.o. daily.  She was on IV fluids.  She did well on this regimen.  She was shaky her second day.  Third day she was improving, and by the fourth day, she was ready for discharge home.  DISCHARGE MEDICATIONS:  Alprazolam 0.5 to 1 t.i.d. for anxiety. Fluoxetine 20 mg daily. Bupropion 200 mg p.r.n. pain. Norethindrone 2.5 mg daily. Multivitamins with minerals 2 tablets daily. Ciprofloxacin 500 mg b.i.d., a 10-day course due to some chest congestion she developed  while in the hospital.  FINAL DISCHARGE DIAGNOSES: 1. Alcohol-withdrawal syndrome. 2. Acute alcoholic hepatitis. 3. Hypokalemia. 4. Thrombocytopenia, probably alcohol-induced with  effects on the bone     marrow. 5. Chronic depression. 6. Chronic intermittent ethyl alcoholism.  PLAN:  I strongly encouraged her to find a buddy so that when she started to feel like she is in the same situation that she had been in the past when she started drinking, she would talk to that person.  If that did not work, she should come to the office immediately and talk to me.  Followup will be in 1-2 weeks, at which time I will recheck her BMP and CBC to make sure her potassium is stable as well as other electrolytes and also her platelet counts come back to normal.     Mila Homer. [name], M.D.     SDK/MEDQ  D:  02/28/2013  T:  02/28/2013  Job:  [id number]

## 2013-03-04 ENCOUNTER — Ambulatory Visit (INDEPENDENT_AMBULATORY_CARE_PROVIDER_SITE_OTHER): Payer: Managed Care, Other (non HMO) | Admitting: Internal Medicine

## 2013-03-04 ENCOUNTER — Encounter: Payer: Self-pay | Admitting: Internal Medicine

## 2013-03-04 VITALS — BP 133/83 | HR 83 | Temp 98.0°F | Ht 64.0 in | Wt 164.0 lb

## 2013-03-04 DIAGNOSIS — K7460 Unspecified cirrhosis of liver: Secondary | ICD-10-CM

## 2013-03-04 NOTE — Progress Notes (Signed)
Primary Care Physician:  Milana Obey, MD Primary Gastroenterologist:  Dr. Jena Gauss  Pre-Procedure History & Physical: HPI:  Katrina Roberts is a 47 y.o. female here for followup of alcoholic induced hepatitis and cirrhosis with recent hospitalization for alcohol withdrawal syndrome. Patient resume drinking since she was last seen here in October. Route of her psych meds. Admitted to [facility]. Now doing better LFTs have normalized count was 51,000 recently hypokalemia was treated. No history of esophageal varices on prior EGD. Interestingly, prior ulcer demonstrates an intact patent TIPS. Patient denies ever having a TIPS and we do not have a record of such a procedure. No melena rectal bleeding. She's gained about 23 pounds since she was last seen here last year.  Past Medical History  Diagnosis Date  . Anxiety   . Depression   . Smoker   . GERD (gastroesophageal reflux disease)   . Headache   . Alcoholic hepatitis   . Liver disease     etoh and +/-autoimmune (h/o elevated immunoglobulins and mildly elevated antismooth muscle ab  . Anemia   . Cirrhosis 02/2012    MRI, afp 04/19/12= 5.3, U/S 02/29/12= no liver tumors, immune to Hep B.   . Elevated AFP 02/2012    12  . Asymptomatic cholelithiasis   . Anemia   . Hiatal hernia     small    Past Surgical History  Procedure Laterality Date  . Esophagogastroduodenoscopy  01/25/2012    Reflux esophagitis, no varices, ?short segment Barrett's, antral erosions. Bx showed reactive gastropathy. No H.Pylor or barrett's.  Procedure: ESOPHAGOGASTRODUODENOSCOPY (EGD);  Surgeon: Corbin Ade, MD;  Location: [facility];  Service: Endoscopy;  Laterality: N/A;    . Colonoscopy  06/2010    Dr. Mathis Bud edematous colon diffusely with diffuse friability, segmental biopsies  unremarkable  . Liver biopsy  04/16/12    poor sample, some fibrosis and no significant iron, inconclusive as far as bile ducts/autoimmune component is concerned    Prior  to Admission medications   Medication Sig Start Date End Date Taking? Authorizing Provider  ALPRAZolam Prudy Feeler) 1 MG tablet Take 0.5-1 mg by mouth 3 (three) times daily as needed. Takes one-half tablet in the morning and at lunch, then take one tablet at bedtime as needed For anxiety 01/31/12  Yes Historical Provider, MD  ciprofloxacin (CIPRO) 500 MG tablet Take 1 tablet (500 mg total) by mouth 2 (two) times daily. 02/28/13  Yes Milana Obey, MD  FLUoxetine (PROZAC) 20 MG tablet Take 20 mg by mouth daily.    Yes Historical Provider, MD  ibuprofen (ADVIL,MOTRIN) 200 MG tablet Take 200 mg by mouth daily as needed. For pain   Yes Historical Provider, MD  Multiple Vitamins-Minerals (MULTIVITAMIN) tablet Take 2 tablets by mouth daily. 02/28/13  Yes Milana Obey, MD  norethindrone (AYGESTIN) 5 MG tablet Take 2.5 mg by mouth daily.   Yes Historical Provider, MD    Allergies as of 03/04/2013 - Review Complete 03/04/2013  Allergen Reaction Noted  . Penicillins Swelling     Family History  Problem Relation Age of Onset  . Breast cancer Mother   . Lung cancer Father   . Cancer Paternal Aunt     unknown primary  . Colon cancer Neg Hx   . Liver disease Neg Hx     History   Social History  . Marital Status: Married    Spouse Name: N/A    Number of Children: 0  . Years of Education: N/A  Occupational History  . Not on file.   Social History Main Topics  . Smoking status: Current Every Day Smoker -- 0.50 packs/day    Types: Cigarettes  . Smokeless tobacco: Not on file  . Alcohol Use: 50.4 oz/week    84 Cans of beer per week  . Drug Use: No  . Sexually Active: Not Currently    Birth Control/ Protection: Post-menopausal   Other Topics Concern  . Not on file   Social History Narrative  . No narrative on file    Review of Systems: See HPI, otherwise negative ROS  Physical Exam: BP 133/83  Pulse 83  Temp(Src) 98 F (36.7 C) (Oral)  Ht 5\' 4"  (1.626 m)  Wt 164 lb (74.39  kg)  BMI 28.14 kg/m2 General:   Somewhat chronically ill lady appears alert conversant in no acute distress Skin:  Intact without significant lesions or rashes. Eyes:  Sclera clear, no icterus.   Conjunctiva pink. Ears:  Normal auditory acuity. Nose:  No deformity, discharge,  or lesions. Mouth:  No deformity or lesions. Neck:  Supple; no masses or thyromegaly. No significant cervical adenopathy. Lungs:  Clear throughout to auscultation.   No wheezes, crackles, or rhonchi. No acute distress. Heart:  Obese. Positive bowel sounds soft nontender. No fluid wave or shifting dullness. Liver edge indistinct the right costal margin.  Abdomen: Non-distended, normal bowel sounds.  Soft and nontender without appreciable mass or hepatosplenomegaly.  Pulses:  Normal pulses noted. Extremities:  Without clubbing or edema.  Impression/Plan:  Recurrent alcohol-induced hepatitis superimposed on underlying cirrhosis - she is recovered from her recent bout of alcohol withdrawal/alcohol-induced hepatitis.  TIPS in place on prior ultrasound. Patient denies having it done  We need to research the record further. She did go to the health Department and get hepatitis A and B vaccine as she reports. Significant weight gain since last year suspected more adipose deposition. Clinically, she appears euvolemic.  Recommendations:    Absolute abstinence from alcohol. Screening hepatic ultrasound at this time. Further recommendations to follow.

## 2013-03-04 NOTE — Patient Instructions (Addendum)
Hepatic ultrasound; history of cirrhosis  Continue not to consume any alcholol  Further recommendations to follow

## 2013-03-07 ENCOUNTER — Ambulatory Visit (HOSPITAL_COMMUNITY)
Admission: RE | Admit: 2013-03-07 | Discharge: 2013-03-07 | Disposition: A | Discharge status: Home / Self Care | Payer: Managed Care, Other (non HMO) | Source: Ambulatory Visit | Attending: Internal Medicine | Admitting: Internal Medicine

## 2013-03-07 ENCOUNTER — Other Ambulatory Visit (HOSPITAL_COMMUNITY): Payer: Self-pay | Admitting: Family Medicine

## 2013-03-07 DIAGNOSIS — Z139 Encounter for screening, unspecified: Secondary | ICD-10-CM

## 2013-03-07 DIAGNOSIS — K7010 Alcoholic hepatitis without ascites: Secondary | ICD-10-CM | POA: Insufficient documentation

## 2013-03-07 DIAGNOSIS — K7460 Unspecified cirrhosis of liver: Secondary | ICD-10-CM

## 2013-03-10 ENCOUNTER — Ambulatory Visit (HOSPITAL_COMMUNITY): Payer: Managed Care, Other (non HMO)

## 2013-03-11 ENCOUNTER — Ambulatory Visit (HOSPITAL_COMMUNITY)
Admission: RE | Admit: 2013-03-11 | Discharge: 2013-03-11 | Disposition: A | Discharge status: Home / Self Care | Payer: Managed Care, Other (non HMO) | Source: Ambulatory Visit | Attending: Family Medicine | Admitting: Family Medicine

## 2013-03-11 DIAGNOSIS — Z1231 Encounter for screening mammogram for malignant neoplasm of breast: Secondary | ICD-10-CM | POA: Insufficient documentation

## 2013-03-11 DIAGNOSIS — Z139 Encounter for screening, unspecified: Secondary | ICD-10-CM

## 2013-04-22 ENCOUNTER — Encounter (HOSPITAL_COMMUNITY): Payer: Self-pay

## 2013-04-22 ENCOUNTER — Emergency Department (HOSPITAL_COMMUNITY)
Admission: EM | Admit: 2013-04-22 | Discharge: 2013-04-22 | Disposition: A | Discharge status: Home / Self Care | Payer: Managed Care, Other (non HMO) | Attending: Emergency Medicine | Admitting: Emergency Medicine

## 2013-04-22 DIAGNOSIS — F1020 Alcohol dependence, uncomplicated: Secondary | ICD-10-CM | POA: Insufficient documentation

## 2013-04-22 DIAGNOSIS — F17200 Nicotine dependence, unspecified, uncomplicated: Secondary | ICD-10-CM | POA: Insufficient documentation

## 2013-04-22 DIAGNOSIS — Z880 Allergy status to penicillin: Secondary | ICD-10-CM | POA: Insufficient documentation

## 2013-04-22 DIAGNOSIS — F411 Generalized anxiety disorder: Secondary | ICD-10-CM | POA: Insufficient documentation

## 2013-04-22 DIAGNOSIS — F329 Major depressive disorder, single episode, unspecified: Secondary | ICD-10-CM | POA: Insufficient documentation

## 2013-04-22 DIAGNOSIS — Z8719 Personal history of other diseases of the digestive system: Secondary | ICD-10-CM | POA: Insufficient documentation

## 2013-04-22 DIAGNOSIS — F3289 Other specified depressive episodes: Secondary | ICD-10-CM | POA: Insufficient documentation

## 2013-04-22 DIAGNOSIS — Z862 Personal history of diseases of the blood and blood-forming organs and certain disorders involving the immune mechanism: Secondary | ICD-10-CM | POA: Insufficient documentation

## 2013-04-22 LAB — CBC WITH DIFFERENTIAL/PLATELET
Eosinophils Relative: 1 % (ref 0–5)
HCT: 45.4 % (ref 36.0–46.0)
Hemoglobin: 16.1 g/dL — ABNORMAL HIGH (ref 12.0–15.0)
Lymphocytes Relative: 34 % (ref 12–46)
Lymphs Abs: 3.2 10*3/uL (ref 0.7–4.0)
MCV: 88.8 fL (ref 78.0–100.0)
Monocytes Absolute: 0.4 10*3/uL (ref 0.1–1.0)
Monocytes Relative: 4 % (ref 3–12)
Neutro Abs: 5.7 10*3/uL (ref 1.7–7.7)
RBC: 5.11 MIL/uL (ref 3.87–5.11)
RDW: 14.3 % (ref 11.5–15.5)
WBC: 9.4 10*3/uL (ref 4.0–10.5)

## 2013-04-22 LAB — RAPID URINE DRUG SCREEN, HOSP PERFORMED
Amphetamines: NOT DETECTED
Barbiturates: NOT DETECTED
Benzodiazepines: NOT DETECTED
Cocaine: NOT DETECTED

## 2013-04-22 LAB — URINALYSIS, ROUTINE W REFLEX MICROSCOPIC
Bilirubin Urine: NEGATIVE
Glucose, UA: NEGATIVE mg/dL
Ketones, ur: 15 mg/dL — AB
Nitrite: NEGATIVE
Specific Gravity, Urine: <1.005 — ABNORMAL LOW (ref 1.005–1.030)
pH: 6 (ref 5.0–8.0)

## 2013-04-22 LAB — BASIC METABOLIC PANEL
BUN: 4 mg/dL — ABNORMAL LOW (ref 6–23)
CO2: 26 mEq/L (ref 19–32)
Calcium: 8.7 mg/dL (ref 8.4–10.5)
Chloride: 101 mEq/L (ref 96–112)
Creatinine, Ser: 0.6 mg/dL (ref 0.50–1.10)
Glucose, Bld: 87 mg/dL (ref 70–99)

## 2013-04-22 MED ORDER — LORAZEPAM 1 MG PO TABS
1.0000 mg | ORAL_TABLET | Freq: Four times a day (QID) | ORAL | Status: DC | PRN
  Filled 2013-04-22: qty 1

## 2013-04-22 MED ORDER — THIAMINE HCL 100 MG/ML IJ SOLN
100.0000 mg | Freq: Every day | INTRAMUSCULAR | Status: DC
  Administered 2013-04-22: 100 mg via INTRAVENOUS
  Filled 2013-04-22: qty 2

## 2013-04-22 MED ORDER — SODIUM CHLORIDE 0.9 % IV BOLUS (SEPSIS): 500.0000 mL | Freq: Once | INTRAVENOUS | Status: DC

## 2013-04-22 MED ORDER — ADULT MULTIVITAMIN W/MINERALS CH
1.0000 | ORAL_TABLET | Freq: Every day | ORAL | Status: DC
  Administered 2013-04-22: 1 via ORAL
  Filled 2013-04-22: qty 1

## 2013-04-22 MED ORDER — LORAZEPAM 1 MG PO TABS: 0.0000 mg | ORAL_TABLET | Freq: Two times a day (BID) | ORAL | Status: DC

## 2013-04-22 MED ORDER — FOLIC ACID 1 MG PO TABS
1.0000 mg | ORAL_TABLET | Freq: Every day | ORAL | Status: DC
  Administered 2013-04-22: 1 mg via ORAL
  Filled 2013-04-22: qty 1

## 2013-04-22 MED ORDER — LORAZEPAM 1 MG PO TABS: 1.0000 mg | ORAL_TABLET | Freq: Four times a day (QID) | ORAL | Status: DC | PRN

## 2013-04-22 MED ORDER — LORAZEPAM 1 MG PO TABS
0.0000 mg | ORAL_TABLET | Freq: Four times a day (QID) | ORAL | Status: DC
  Administered 2013-04-22: 1 mg via ORAL

## 2013-04-22 MED ORDER — LORAZEPAM 2 MG/ML IJ SOLN: 1.0000 mg | Freq: Four times a day (QID) | INTRAMUSCULAR | Status: DC | PRN

## 2013-04-22 MED ORDER — VITAMIN B-1 100 MG PO TABS
100.0000 mg | ORAL_TABLET | Freq: Every day | ORAL | Status: DC
  Filled 2013-04-22: qty 1

## 2013-04-22 NOTE — ED Notes (Signed)
Pt alert and cooperative at this time. Denies needs at present.  VS stable.  No distress noted.  Pt denies needs at present time.

## 2013-04-22 NOTE — ED Notes (Signed)
Pt here for IV fluids and medication for ETOH  Abuse. Pt states she has been here before for the same. States she does not want to be sent anywhere for help. States she last drank 6 beers around 1 pm today. Husband with patient.

## 2013-04-22 NOTE — ED Provider Notes (Signed)
History    This chart was scribed for [employer]. Rubin Payor, MD by Quintella Reichert, ED scribe.  This patient was seen in room APA16A/APA16A and the patient's care was started at 4:08 PM.   CSN: [id number]  Arrival date & time 04/22/13  1547      Chief Complaint  Patient presents with  . Alcohol Problem     The history is provided by the patient. No language interpreter was used.    HPI Comments: Katrina Roberts is a 47 y.o. female brought by husband to the Emergency Department complaining of alcohol abuse that began 1 week ago.  Pt states that she has been drinking one 12-pack per day in the past week.  She has h/o alcohol abuse and was seen in the ED 2 months ago for the same level of alcohol use.  At that time she had to be hospitalized for withdrawal.  She states that she was sober after her discharge until 1 week ago and states "I don't know what happened."  Today she states she drank 6 beers at around 1 PM.   She states she feels similar to her last ED visit.  She states that she would like withdrawal treatment but declines detox admission.  Pt also has h/o depression.  She states that apart from her feelings in regard to her relapse her depression has been under control.  She denies nausea, emesis, or any other associated symptoms.  Pt is a one-pack-per-day smoker.    Past Medical History  Diagnosis Date  . Anxiety   . Depression   . Smoker   . GERD (gastroesophageal reflux disease)   . Headache(784.0)   . Alcoholic hepatitis   . Liver disease     etoh and +/-autoimmune (h/o elevated immunoglobulins and mildly elevated antismooth muscle ab  . Anemia   . Cirrhosis 02/2012    pt has completed her Hep A/B vaccinations.MRI, afp 04/19/12= 5.3, U/S 02/29/12= no liver tumors, immune to Hep B.   . Elevated AFP 02/2012    12  . Asymptomatic cholelithiasis   . Anemia   . Hiatal hernia     small    Past Surgical History  Procedure Laterality Date  . Esophagogastroduodenoscopy   01/25/2012    Reflux esophagitis, no varices, ?short segment Barrett's, antral erosions. Bx showed reactive gastropathy. No H.Pylor or barrett's.  Procedure: ESOPHAGOGASTRODUODENOSCOPY (EGD);  Surgeon: Corbin Ade, MD;  Location: [facility];  Service: Endoscopy;  Laterality: N/A;    . Colonoscopy  06/2010    Dr. Mathis Bud edematous colon diffusely with diffuse friability, segmental biopsies  unremarkable  . Liver biopsy  04/16/12    poor sample, some fibrosis and no significant iron, inconclusive as far as bile ducts/autoimmune component is concerned    Family History  Problem Relation Age of Onset  . Breast cancer Mother   . Lung cancer Father   . Cancer Paternal Aunt     unknown primary  . Colon cancer Neg Hx   . Liver disease Neg Hx     History  Substance Use Topics  . Smoking status: Current Every Day Smoker -- 0.50 packs/day    Types: Cigarettes  . Smokeless tobacco: Not on file  . Alcohol Use: 50.4 oz/week    84 Cans of beer per week    OB History   Grav Para Term Preterm Abortions TAB SAB Ect Mult Living  Review of Systems  Constitutional: Negative for fever and chills.  HENT: Negative for sore throat, rhinorrhea and trouble swallowing.   Respiratory: Negative for cough and shortness of breath.   Cardiovascular: Negative for chest pain and leg swelling.  Gastrointestinal: Negative for nausea, vomiting, abdominal pain and diarrhea.  Genitourinary: Negative for dysuria, urgency, frequency, hematuria and difficulty urinating.  Musculoskeletal: Negative for back pain and arthralgias.  Skin: Negative for rash and wound.  Neurological: Negative for dizziness, weakness, numbness and headaches.  Psychiatric/Behavioral: Negative for behavioral problems. The patient is not nervous/anxious.        Alcohol intoxication    Allergies  Penicillins  Home Medications   Current Outpatient Rx  Name  Route  Sig  Dispense  Refill  . ALPRAZolam (XANAX) 1  MG tablet   Oral   Take 0.5-1 mg by mouth 3 (three) times daily as needed. Takes one-half tablet in the morning and at lunch, then take one tablet at bedtime as needed For anxiety         . FLUoxetine (PROZAC) 20 MG tablet   Oral   Take 20 mg by mouth daily.          Marland Kitchen ibuprofen (ADVIL,MOTRIN) 200 MG tablet   Oral   Take 200 mg by mouth daily as needed. For pain         . norethindrone (AYGESTIN) 5 MG tablet   Oral   Take 2.5 mg by mouth daily.         Marland Kitchen LORazepam (ATIVAN) 1 MG tablet   Oral   Take 1-2 tablets (1-2 mg total) by mouth every 6 (six) hours as needed (withdrawal).   15 tablet   0     BP 135/77  Pulse 96  Temp(Src) 98.4 F (36.9 C) (Oral)  Resp 18  Ht 5\' 4"  (1.626 m)  Wt 160 lb (72.576 kg)  BMI 27.45 kg/m2  SpO2 94%  Physical Exam  Nursing note and vitals reviewed. Constitutional: She appears well-developed and well-nourished. No distress.  No shaking in hands  HENT:  Head: Normocephalic and atraumatic.  Eyes: Conjunctivae are normal.  Neck: Normal range of motion. Neck supple.  Cardiovascular: Normal rate, regular rhythm and normal heart sounds.   No murmur heard. Pulmonary/Chest: Effort normal and breath sounds normal. No respiratory distress. She has no wheezes. She has no rales.  Neurological: She is alert. Coordination normal.  Skin: Skin is warm and dry.  Psychiatric: Her behavior is normal.  Mildly depressed    ED Course  Procedures (including critical care time)   COORDINATION OF CARE: 4:14 PM-Discussed treatment plan which includes IV fluids and labs with pt at bedside and pt agreed to plan.   9:43 PM; Informed pt that labs indicate she is ready for discharge.  Discussed treatment plan including final Ativan treatment followed by discharge.  Advised pt to seek long-term treatment for alcohol abuse in order to avoid further relapses.  Pt expressed understanding and was agreeable.   Results for orders placed during the hospital  encounter of 04/22/13  CBC WITH DIFFERENTIAL      Result Value Range   WBC 9.4  4.0 - 10.5 K/uL   RBC 5.11  3.87 - 5.11 MIL/uL   Hemoglobin 16.1 (*) 12.0 - 15.0 g/dL   HCT 16.1  09.6 - 04.5 %   MCV 88.8  78.0 - 100.0 fL   MCH 31.5  26.0 - 34.0 pg   MCHC 35.5  30.0 - 36.0 g/dL  RDW 14.3  11.5 - 15.5 %   Platelets 177  150 - 400 K/uL   Neutrophils Relative % 61  43 - 77 %   Neutro Abs 5.7  1.7 - 7.7 K/uL   Lymphocytes Relative 34  12 - 46 %   Lymphs Abs 3.2  0.7 - 4.0 K/uL   Monocytes Relative 4  3 - 12 %   Monocytes Absolute 0.4  0.1 - 1.0 K/uL   Eosinophils Relative 1  0 - 5 %   Eosinophils Absolute 0.1  0.0 - 0.7 K/uL   Basophils Relative 0  0 - 1 %   Basophils Absolute 0.0  0.0 - 0.1 K/uL  BASIC METABOLIC PANEL      Result Value Range   Sodium 143  135 - 145 mEq/L   Potassium 3.9  3.5 - 5.1 mEq/L   Chloride 101  96 - 112 mEq/L   CO2 26  19 - 32 mEq/L   Glucose, Bld 87  70 - 99 mg/dL   BUN 4 (*) 6 - 23 mg/dL   Creatinine, Ser 0.45  0.50 - 1.10 mg/dL   Calcium 8.7  8.4 - 40.9 mg/dL   GFR calc non Af Amer >90  >90 mL/min   GFR calc Af Amer >90  >90 mL/min  ETHANOL      Result Value Range   Alcohol, Ethyl (B) 324 (*) 0 - 11 mg/dL  URINALYSIS, ROUTINE W REFLEX MICROSCOPIC      Result Value Range   Color, Urine YELLOW  YELLOW   APPearance CLEAR  CLEAR   Specific Gravity, Urine <1.005 (*) 1.005 - 1.030   pH 6.0  5.0 - 8.0   Glucose, UA NEGATIVE  NEGATIVE mg/dL   Hgb urine dipstick NEGATIVE  NEGATIVE   Bilirubin Urine NEGATIVE  NEGATIVE   Ketones, ur 15 (*) NEGATIVE mg/dL   Protein, ur NEGATIVE  NEGATIVE mg/dL   Urobilinogen, UA 0.2  0.0 - 1.0 mg/dL   Nitrite NEGATIVE  NEGATIVE   Leukocytes, UA NEGATIVE  NEGATIVE  URINE RAPID DRUG SCREEN (HOSP PERFORMED)      Result Value Range   Opiates NONE DETECTED  NONE DETECTED   Cocaine NONE DETECTED  NONE DETECTED   Benzodiazepines NONE DETECTED  NONE DETECTED   Amphetamines NONE DETECTED  NONE DETECTED    Tetrahydrocannabinol NONE DETECTED  NONE DETECTED   Barbiturates NONE DETECTED  NONE DETECTED   No results found.      1. Alcoholism       MDM  Patient with alcohol abuse. She does not want further treatment for her. She's not had severe withdrawal since she's been here. I discussed the case with Dr. [name]. She is requesting medications to help with her withdrawal at home. She'll be discharged with Ativan will followup with her PCP. He was recommended to her that she go into a more intensive detox/treatment. She will not do this.     I personally performed the services described in this documentation, which was scribed in my presence. The recorded information has been reviewed and is accurate.     Juliet Rude. Rubin Payor, MD 04/22/13 2208

## 2013-05-24 ENCOUNTER — Emergency Department (HOSPITAL_COMMUNITY)
Admission: EM | Admit: 2013-05-24 | Discharge: 2013-05-24 | Disposition: A | Discharge status: Home / Self Care | Payer: Managed Care, Other (non HMO) | Attending: Emergency Medicine | Admitting: Emergency Medicine

## 2013-05-24 ENCOUNTER — Encounter (HOSPITAL_COMMUNITY): Payer: Self-pay | Admitting: *Deleted

## 2013-05-24 DIAGNOSIS — F3289 Other specified depressive episodes: Secondary | ICD-10-CM | POA: Insufficient documentation

## 2013-05-24 DIAGNOSIS — F411 Generalized anxiety disorder: Secondary | ICD-10-CM | POA: Insufficient documentation

## 2013-05-24 DIAGNOSIS — Z880 Allergy status to penicillin: Secondary | ICD-10-CM | POA: Insufficient documentation

## 2013-05-24 DIAGNOSIS — Z862 Personal history of diseases of the blood and blood-forming organs and certain disorders involving the immune mechanism: Secondary | ICD-10-CM | POA: Insufficient documentation

## 2013-05-24 DIAGNOSIS — F17200 Nicotine dependence, unspecified, uncomplicated: Secondary | ICD-10-CM | POA: Insufficient documentation

## 2013-05-24 DIAGNOSIS — F329 Major depressive disorder, single episode, unspecified: Secondary | ICD-10-CM | POA: Insufficient documentation

## 2013-05-24 DIAGNOSIS — F10229 Alcohol dependence with intoxication, unspecified: Secondary | ICD-10-CM | POA: Insufficient documentation

## 2013-05-24 DIAGNOSIS — F1010 Alcohol abuse, uncomplicated: Secondary | ICD-10-CM

## 2013-05-24 DIAGNOSIS — Z8719 Personal history of other diseases of the digestive system: Secondary | ICD-10-CM | POA: Insufficient documentation

## 2013-05-24 DIAGNOSIS — Z79899 Other long term (current) drug therapy: Secondary | ICD-10-CM | POA: Insufficient documentation

## 2013-05-24 NOTE — ED Provider Notes (Signed)
History    This chart was scribed for Vida Roller, MD by Leone Payor, ED Scribe. This patient was seen in room APA03/APA03 and the patient's care was started 1:34 PM.  CSN: [id number] Arrival date & time 05/24/13  1255  First MD Initiated Contact with Patient 05/24/13 1332     Chief Complaint  Patient presents with  . V70.1    The history is provided by the patient. No language interpreter was used.    HPI Comments: Katrina Roberts is a 47 y.o. female who presents to the Emergency Department requesting alcohol detox today. Pt states she has been drinking a 12 pack everyday for the past 2 weeks. Reports she had stopped drinking prior to that for about 2 weeks but she started back again. Pt denies wanting inpatient detox but just someone to talk to today. Her last drink was today at 12 noon. States she is worried about her drinking problem but she is not scared of it. Pt believes the alcohol will lead to her dying but she is not afraid of that. She recognizes that this is problem. She has tried detox program once in the past but disliked it. She denies SI. She denies any street drug use. Pt is a current everyday smoker but denies alcohol use.  She states she just wants to talk to someone sooner than August when she has a scheduled psych appointment.  She is not hallucinating - drank 2 hours pta.  Past Medical History  Diagnosis Date  . Anxiety   . Depression   . Smoker   . GERD (gastroesophageal reflux disease)   . Headache(784.0)   . Alcoholic hepatitis   . Liver disease     etoh and +/-autoimmune (h/o elevated immunoglobulins and mildly elevated antismooth muscle ab  . Anemia   . Cirrhosis 02/2012    pt has completed her Hep A/B vaccinations.MRI, afp 04/19/12= 5.3, U/S 02/29/12= no liver tumors, immune to Hep B.   . Elevated AFP 02/2012    12  . Asymptomatic cholelithiasis   . Anemia   . Hiatal hernia     small   Past Surgical History  Procedure Laterality Date  .  Esophagogastroduodenoscopy  01/25/2012    Reflux esophagitis, no varices, ?short segment Barrett's, antral erosions. Bx showed reactive gastropathy. No H.Pylor or barrett's.  Procedure: ESOPHAGOGASTRODUODENOSCOPY (EGD);  Surgeon: Corbin Ade, MD;  Location: [facility];  Service: Endoscopy;  Laterality: N/A;    . Colonoscopy  06/2010    Dr. Mathis Bud edematous colon diffusely with diffuse friability, segmental biopsies  unremarkable  . Liver biopsy  04/16/12    poor sample, some fibrosis and no significant iron, inconclusive as far as bile ducts/autoimmune component is concerned   Family History  Problem Relation Age of Onset  . Breast cancer Mother   . Lung cancer Father   . Cancer Paternal Aunt     unknown primary  . Colon cancer Neg Hx   . Liver disease Neg Hx    History  Substance Use Topics  . Smoking status: Current Every Day Smoker -- 0.50 packs/day    Types: Cigarettes  . Smokeless tobacco: Not on file  . Alcohol Use: 50.4 oz/week    84 Cans of beer per week   OB History   Grav Para Term Preterm Abortions TAB SAB Ect Mult Living                 Review of Systems A complete  10 system review of systems was obtained and all systems are negative except as noted in the HPI and PMH.   Allergies  Penicillins  Home Medications   Current Outpatient Rx  Name  Route  Sig  Dispense  Refill  . ALPRAZolam (XANAX) 1 MG tablet   Oral   Take 0.5-1 mg by mouth 3 (three) times daily as needed. Takes one-half tablet in the morning and at lunch, then take one tablet at bedtime as needed For anxiety         . FLUoxetine (PROZAC) 20 MG tablet   Oral   Take 20 mg by mouth daily.          Marland Kitchen ibuprofen (ADVIL,MOTRIN) 200 MG tablet   Oral   Take 200 mg by mouth daily as needed. For pain         . norethindrone (AYGESTIN) 5 MG tablet   Oral   Take 2.5 mg by mouth daily.          BP 141/92  Pulse 117  Temp(Src) 99.3 F (37.4 C) (Oral)  Resp 16  Ht 5\' 4"  (1.626 m)   SpO2 100% Physical Exam  Nursing note and vitals reviewed. Constitutional: She is oriented to person, place, and time. She appears well-developed and well-nourished.  HENT:  Head: Normocephalic and atraumatic.  MMM  Eyes: Conjunctivae and EOM are normal. Pupils are equal, round, and reactive to light. No scleral icterus.  Neck: Normal range of motion. Neck supple.  Cardiovascular: Regular rhythm and normal heart sounds.   No murmur heard. Mild tachycardia - 105 on my exam  Pulmonary/Chest: Effort normal and breath sounds normal. No respiratory distress. She has no wheezes. She has no rales. She exhibits no tenderness.  Abdominal: Soft. Bowel sounds are normal. There is no tenderness.  No masses or ttp.  Musculoskeletal: Normal range of motion.  Neurological: She is alert and oriented to person, place, and time.  Skin: Skin is warm and dry.  Psychiatric: She exhibits a depressed mood.  Appears sad and depressed.  Not responding to internal stimuli - has no hallucinations, no SI / HI    ED Course  Procedures (including critical care time)  DIAGNOSTIC STUDIES: Oxygen Saturation is 100% on RA, normal by my interpretation.    COORDINATION OF CARE: 1:38 PM Discussed treatment plan with pt at bedside and pt agreed to plan.   Labs Reviewed - No data to display No results found. 1. Alcohol abuse     MDM  At this time the pt states that she just wants someone to talk to - I have told her that this kind of counseling is done in outpt setting, she is amenable to this plan, she refuses detox.  She has benign exam otherwise, she is taking PO without vomiting, will contact [facility] to expedite some psych f/u.  Meds given in ED:  Medications - No data to display  New Prescriptions   No medications on file    Care d/w Samson Frederic with [facility] - recommends [facility]  I personally performed the services described in this documentation, which was scribed in my presence. The recorded information has been  reviewed and is accurate.      Vida Roller, MD 05/24/13 1359

## 2013-05-24 NOTE — ED Notes (Signed)
Pt states that she is here to talk to her psychiatrist.  The patient states that she knows that she has a problem with alcohol, however, states that she does not want inpatient hospitalization for detox unless if she is here at [facility].

## 2013-05-24 NOTE — ED Notes (Signed)
Pt presents to er with request of detox from alcohol abuse, states that she has been drinking a 12 pack everyday for the past two weeks, has had problems with alcohol abuse before. Denies any Si/HI.

## 2013-06-18 ENCOUNTER — Ambulatory Visit (HOSPITAL_COMMUNITY)
Admission: RE | Admit: 2013-06-18 | Discharge: 2013-06-18 | Disposition: A | Discharge status: Home / Self Care | Payer: Managed Care, Other (non HMO) | Source: Ambulatory Visit | Attending: Family Medicine | Admitting: Family Medicine

## 2013-06-18 ENCOUNTER — Other Ambulatory Visit (HOSPITAL_COMMUNITY): Payer: Self-pay | Admitting: Family Medicine

## 2013-06-18 DIAGNOSIS — F17200 Nicotine dependence, unspecified, uncomplicated: Secondary | ICD-10-CM

## 2013-06-18 DIAGNOSIS — R059 Cough, unspecified: Secondary | ICD-10-CM | POA: Insufficient documentation

## 2013-06-18 DIAGNOSIS — J40 Bronchitis, not specified as acute or chronic: Secondary | ICD-10-CM

## 2013-06-18 DIAGNOSIS — R05 Cough: Secondary | ICD-10-CM | POA: Insufficient documentation

## 2013-07-22 ENCOUNTER — Encounter (HOSPITAL_COMMUNITY): Payer: Self-pay | Admitting: *Deleted

## 2013-07-22 ENCOUNTER — Emergency Department (HOSPITAL_COMMUNITY)
Admission: EM | Admit: 2013-07-22 | Discharge: 2013-07-22 | Disposition: A | Discharge status: Home / Self Care | Payer: Managed Care, Other (non HMO) | Attending: Emergency Medicine | Admitting: Emergency Medicine

## 2013-07-22 DIAGNOSIS — Z8719 Personal history of other diseases of the digestive system: Secondary | ICD-10-CM | POA: Insufficient documentation

## 2013-07-22 DIAGNOSIS — F3289 Other specified depressive episodes: Secondary | ICD-10-CM | POA: Insufficient documentation

## 2013-07-22 DIAGNOSIS — R1013 Epigastric pain: Secondary | ICD-10-CM | POA: Insufficient documentation

## 2013-07-22 DIAGNOSIS — F17200 Nicotine dependence, unspecified, uncomplicated: Secondary | ICD-10-CM | POA: Insufficient documentation

## 2013-07-22 DIAGNOSIS — K219 Gastro-esophageal reflux disease without esophagitis: Secondary | ICD-10-CM | POA: Insufficient documentation

## 2013-07-22 DIAGNOSIS — Z862 Personal history of diseases of the blood and blood-forming organs and certain disorders involving the immune mechanism: Secondary | ICD-10-CM | POA: Insufficient documentation

## 2013-07-22 DIAGNOSIS — F1010 Alcohol abuse, uncomplicated: Secondary | ICD-10-CM

## 2013-07-22 DIAGNOSIS — Z9889 Other specified postprocedural states: Secondary | ICD-10-CM | POA: Insufficient documentation

## 2013-07-22 DIAGNOSIS — Z79899 Other long term (current) drug therapy: Secondary | ICD-10-CM | POA: Insufficient documentation

## 2013-07-22 DIAGNOSIS — Z3202 Encounter for pregnancy test, result negative: Secondary | ICD-10-CM | POA: Insufficient documentation

## 2013-07-22 DIAGNOSIS — K2970 Gastritis, unspecified, without bleeding: Secondary | ICD-10-CM

## 2013-07-22 DIAGNOSIS — F411 Generalized anxiety disorder: Secondary | ICD-10-CM | POA: Insufficient documentation

## 2013-07-22 DIAGNOSIS — R112 Nausea with vomiting, unspecified: Secondary | ICD-10-CM | POA: Insufficient documentation

## 2013-07-22 DIAGNOSIS — Z880 Allergy status to penicillin: Secondary | ICD-10-CM | POA: Insufficient documentation

## 2013-07-22 DIAGNOSIS — F329 Major depressive disorder, single episode, unspecified: Secondary | ICD-10-CM | POA: Insufficient documentation

## 2013-07-22 LAB — CBC WITH DIFFERENTIAL/PLATELET
Basophils Absolute: 0 10*3/uL (ref 0.0–0.1)
Basophils Relative: 0 % (ref 0–1)
Eosinophils Absolute: 0 10*3/uL (ref 0.0–0.7)
Hemoglobin: 16.3 g/dL — ABNORMAL HIGH (ref 12.0–15.0)
Lymphocytes Relative: 25 % (ref 12–46)
MCHC: 35.9 g/dL (ref 30.0–36.0)
Monocytes Absolute: 0.6 10*3/uL (ref 0.1–1.0)
Neutrophils Relative %: 68 % (ref 43–77)
Platelets: 112 10*3/uL — ABNORMAL LOW (ref 150–400)
RBC: 5.06 MIL/uL (ref 3.87–5.11)

## 2013-07-22 LAB — POCT PREGNANCY, URINE: Preg Test, Ur: NEGATIVE

## 2013-07-22 LAB — URINALYSIS, ROUTINE W REFLEX MICROSCOPIC
Leukocytes, UA: NEGATIVE
Nitrite: NEGATIVE
Protein, ur: NEGATIVE mg/dL
Specific Gravity, Urine: <1.005 — ABNORMAL LOW (ref 1.005–1.030)
Urobilinogen, UA: 0.2 mg/dL (ref 0.0–1.0)

## 2013-07-22 LAB — RAPID URINE DRUG SCREEN, HOSP PERFORMED
Cocaine: NOT DETECTED
Opiates: NOT DETECTED
Tetrahydrocannabinol: NOT DETECTED

## 2013-07-22 LAB — BASIC METABOLIC PANEL
Calcium: 9.1 mg/dL (ref 8.4–10.5)
GFR calc Af Amer: >90 mL/min (ref >90)
GFR calc non Af Amer: >90 mL/min (ref >90)
Glucose, Bld: 85 mg/dL (ref 70–99)
Potassium: 3.2 mEq/L — ABNORMAL LOW (ref 3.5–5.1)
Sodium: 134 mEq/L — ABNORMAL LOW (ref 135–145)

## 2013-07-22 MED ORDER — SODIUM CHLORIDE 0.9 % IV BOLUS (SEPSIS)
1000.0000 mL | Freq: Once | INTRAVENOUS | Status: AC
  Administered 2013-07-22: 1000 mL via INTRAVENOUS

## 2013-07-22 MED ORDER — PANTOPRAZOLE SODIUM 20 MG PO TBEC: 40.0000 mg | DELAYED_RELEASE_TABLET | Freq: Every day | ORAL | Status: AC

## 2013-07-22 MED ORDER — ONDANSETRON HCL 4 MG/2ML IJ SOLN
4.0000 mg | Freq: Once | INTRAMUSCULAR | Status: AC
  Administered 2013-07-22: 4 mg via INTRAVENOUS
  Filled 2013-07-22: qty 2

## 2013-07-22 MED ORDER — POTASSIUM CHLORIDE CRYS ER 20 MEQ PO TBCR
40.0000 meq | EXTENDED_RELEASE_TABLET | Freq: Once | ORAL | Status: AC
  Administered 2013-07-22: 40 meq via ORAL
  Filled 2013-07-22: qty 2

## 2013-07-22 MED ORDER — ONDANSETRON 4 MG PO TBDP: ORAL_TABLET | ORAL | Status: AC

## 2013-07-22 MED ORDER — PANTOPRAZOLE SODIUM 40 MG IV SOLR
40.0000 mg | Freq: Once | INTRAVENOUS | Status: AC
  Administered 2013-07-22: 40 mg via INTRAVENOUS
  Filled 2013-07-22: qty 40

## 2013-07-22 NOTE — ED Notes (Signed)
Patient requesting detox from alcohol abuse.  Began drinking at age 47.  Has been through rehab before, last time unknown.  Has been drinking heavily x 2 1/2 weeks.  Began having stomach pains and hematemesis today.

## 2013-07-22 NOTE — ED Provider Notes (Signed)
CSN: [id number]     Arrival date & time 07/22/13  1541 History  This chart was scribed for Benny Lennert, MD by Ronal Fear, ED Scribe. This patient was seen in room APA15/APA15 and the patient's care was started at 4:20 PM.    Chief Complaint  Patient presents with  . Medical Clearance    Patient is a 47 y.o. female presenting with alcohol problem. The history is provided by the patient. No language interpreter was used.  Alcohol Problem This is a chronic (26 years) problem. The current episode started more than 1 week ago. The problem occurs constantly. The problem has been gradually worsening. Associated symptoms include abdominal pain. Pertinent negatives include no chest pain and no headaches.   HPI Comments: Katrina Roberts is a 47 y.o. female who presents to the Emergency Department requesting detox from alcohol. She states that she is seeking in patient treatment. Pt states that she began drinking at age 16, and that she currently drinks about a 12 pack/day. She reports failed attempts at rehab in the past. She states that her last admission for rehab was in June 2014. She also reports mild, epigastric abdominal pain, along with nausea and emesis onset this morning.  PCP: Dr. John Giovanni   Past Medical History  Diagnosis Date  . Anxiety   . Depression   . Smoker   . GERD (gastroesophageal reflux disease)   . Headache(784.0)   . Alcoholic hepatitis   . Liver disease     etoh and +/-autoimmune (h/o elevated immunoglobulins and mildly elevated antismooth muscle ab  . Anemia   . Cirrhosis 02/2012    pt has completed her Hep A/B vaccinations.MRI, afp 04/19/12= 5.3, U/S 02/29/12= no liver tumors, immune to Hep B.   . Elevated AFP 02/2012    12  . Asymptomatic cholelithiasis   . Anemia   . Hiatal hernia     small   Past Surgical History  Procedure Laterality Date  . Esophagogastroduodenoscopy  01/25/2012    Reflux esophagitis, no varices, ?short segment Barrett's, antral  erosions. Bx showed reactive gastropathy. No H.Pylor or barrett's.  Procedure: ESOPHAGOGASTRODUODENOSCOPY (EGD);  Surgeon: Corbin Ade, MD;  Location: [facility];  Service: Endoscopy;  Laterality: N/A;    . Colonoscopy  06/2010    Dr. Mathis Bud edematous colon diffusely with diffuse friability, segmental biopsies  unremarkable  . Liver biopsy  04/16/12    poor sample, some fibrosis and no significant iron, inconclusive as far as bile ducts/autoimmune component is concerned   Family History  Problem Relation Age of Onset  . Breast cancer Mother   . Lung cancer Father   . Cancer Paternal Aunt     unknown primary  . Colon cancer Neg Hx   . Liver disease Neg Hx    History  Substance Use Topics  . Smoking status: Current Every Day Smoker -- 1.00 packs/day    Types: Cigarettes  . Smokeless tobacco: Not on file  . Alcohol Use: 50.4 oz/week    84 Cans of beer per week     Comment: 12 pack/day of beer   OB History   Grav Para Term Preterm Abortions TAB SAB Ect Mult Living                 Review of Systems  Constitutional: Negative for appetite change and fatigue.  HENT: Negative for congestion, sinus pressure and ear discharge.   Eyes: Negative for discharge.  Respiratory: Negative for cough.  Cardiovascular: Negative for chest pain.  Gastrointestinal: Positive for nausea and abdominal pain. Negative for diarrhea.  Genitourinary: Negative for frequency and hematuria.  Musculoskeletal: Negative for back pain.  Skin: Negative for rash.  Neurological: Negative for seizures and headaches.  Psychiatric/Behavioral: Negative for hallucinations.    Allergies  Penicillins  Home Medications   Current Outpatient Rx  Name  Route  Sig  Dispense  Refill  . ALPRAZolam (XANAX) 1 MG tablet   Oral   Take 0.5-1 mg by mouth 3 (three) times daily as needed. Takes one-half tablet in the morning and at lunch, then take one tablet at bedtime as needed For anxiety         .  chlordiazePOXIDE (LIBRIUM) 10 MG capsule   Oral   Take 10-20 mg by mouth 3 (three) times daily.         Marland Kitchen FLUoxetine (PROZAC) 20 MG tablet   Oral   Take 20 mg by mouth daily.          . naltrexone (DEPADE) 50 MG tablet   Oral   Take 50 mg by mouth daily.         . norethindrone (AYGESTIN) 5 MG tablet   Oral   Take 2.5 mg by mouth daily.         Marland Kitchen ibuprofen (ADVIL,MOTRIN) 200 MG tablet   Oral   Take 200 mg by mouth daily as needed. For pain         . ondansetron (ZOFRAN ODT) 4 MG disintegrating tablet      4mg  ODT q6 hours prn nausea/vomit   20 tablet   0   . pantoprazole (PROTONIX) 20 MG tablet   Oral   Take 2 tablets (40 mg total) by mouth daily.   30 tablet   0    Triage Vitals: BP 137/87  Pulse 123  Temp(Src) 98.3 F (36.8 C) (Oral)  Resp 18  Ht 5\' 4"  (1.626 m)  Wt 151 lb 2 oz (68.55 kg)  BMI 25.93 kg/m2  SpO2 98%  Physical Exam  Nursing note and vitals reviewed. Constitutional: She is oriented to person, place, and time. She appears well-developed and well-nourished. No distress.  HENT:  Head: Normocephalic and atraumatic.  Eyes: EOM are normal.  Neck: Neck supple. No tracheal deviation present.  Cardiovascular: Normal rate.   Pulmonary/Chest: Effort normal. No respiratory distress.  Abdominal:  Mild epigastric tenderness  Musculoskeletal: Normal range of motion.  Neurological: She is alert and oriented to person, place, and time.  Skin: Skin is warm and dry.  Psychiatric: She has a normal mood and affect. Her behavior is normal.    ED Course  Procedures (including critical care time)  DIAGNOSTIC STUDIES: Oxygen Saturation is 98% on RA, normal by my interpretation.    COORDINATION OF CARE: 4:26 PM- Pt advised of plan for treatment and pt agrees.  7:53 PM- Upon recheck pt changed her mind and denies detox.She will be discharged with nausea medication. Pt was advised that her drinking habits are detrimental to her health, with a very  real possibility of death in the near future.  Medications  pantoprazole (PROTONIX) injection 40 mg (40 mg Intravenous Given 07/22/13 1639)  sodium chloride 0.9 % bolus 1,000 mL (0 mLs Intravenous Stopped 07/22/13 1834)  potassium chloride SA (K-DUR,KLOR-CON) CR tablet 40 mEq (40 mEq Oral Given 07/22/13 1839)  ondansetron (ZOFRAN) injection 4 mg (4 mg Intravenous Given 07/22/13 1949)    Labs Review Labs Reviewed  CBC WITH DIFFERENTIAL -  Abnormal; Notable for the following:    Hemoglobin 16.3 (*)    Platelets 112 (*)    All other components within normal limits  BASIC METABOLIC PANEL - Abnormal; Notable for the following:    Sodium 134 (*)    Potassium 3.2 (*)    Chloride 88 (*)    All other components within normal limits  URINALYSIS, ROUTINE W REFLEX MICROSCOPIC - Abnormal; Notable for the following:    Specific Gravity, Urine <1.005 (*)    Ketones, ur 40 (*)    All other components within normal limits  ETHANOL - Abnormal; Notable for the following:    Alcohol, Ethyl (B) 298 (*)    All other components within normal limits  URINE RAPID DRUG SCREEN (HOSP PERFORMED)  POCT PREGNANCY, URINE   Imaging Review No results found.  MDM   1. Alcohol abuse   2. Gastritis      The chart was scribed for me under my direct supervision.  I personally performed the history, physical, and medical decision making and all procedures in the evaluation of this patient.Benny Lennert, MD 07/22/13 2002
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
## 2013-07-22 NOTE — ED Notes (Signed)
Appt made with [facility] for 1930 by Elijah Birk for Tele Consult.  Nurse informed.

## 2013-07-22 NOTE — ED Notes (Signed)
Denies SI or HI, wants detox from ETOH for more than one day, admits to drinking 8 12oz beers today, denies any street drugs

## 2013-08-26 ENCOUNTER — Encounter: Payer: Self-pay | Admitting: General Practice

## 2013-08-26 ENCOUNTER — Telehealth: Payer: Self-pay | Admitting: Internal Medicine

## 2013-08-26 NOTE — Telephone Encounter (Signed)
Letter mailed about radiology reminder 

## 2013-08-26 NOTE — Telephone Encounter (Signed)
Pt is on Oct recall to have U/S in 6months °

## 2013-12-24 ENCOUNTER — Other Ambulatory Visit: Payer: Self-pay | Admitting: Obstetrics and Gynecology

## 2014-02-20 ENCOUNTER — Other Ambulatory Visit (HOSPITAL_COMMUNITY): Payer: Self-pay | Admitting: Family Medicine

## 2014-02-20 DIAGNOSIS — Z1231 Encounter for screening mammogram for malignant neoplasm of breast: Secondary | ICD-10-CM

## 2014-03-12 ENCOUNTER — Ambulatory Visit (HOSPITAL_COMMUNITY): Payer: Managed Care, Other (non HMO)

## 2014-03-19 ENCOUNTER — Ambulatory Visit (HOSPITAL_COMMUNITY): Payer: Managed Care, Other (non HMO)

## 2014-04-06 ENCOUNTER — Ambulatory Visit (HOSPITAL_COMMUNITY)
Admission: RE | Admit: 2014-04-06 | Discharge: 2014-04-06 | Disposition: A | Discharge status: Home / Self Care | Payer: 59 | Source: Ambulatory Visit | Attending: Family Medicine | Admitting: Family Medicine

## 2014-04-06 ENCOUNTER — Other Ambulatory Visit (HOSPITAL_COMMUNITY): Payer: Self-pay | Admitting: Family Medicine

## 2014-04-06 DIAGNOSIS — J40 Bronchitis, not specified as acute or chronic: Secondary | ICD-10-CM | POA: Insufficient documentation

## 2014-05-27 DEATH — deceased
